# Patient Record
Sex: Male | Born: 1965 | Race: White | Hispanic: No | Marital: Married | Smoking: Current every day smoker
Health system: Southern US, Community
[De-identification: ages and names within clinical notes are randomized; demographics above are authoritative.]

## PROBLEM LIST (undated history)

## (undated) DIAGNOSIS — K219 Gastro-esophageal reflux disease without esophagitis: Secondary | ICD-10-CM

## (undated) DIAGNOSIS — M1990 Unspecified osteoarthritis, unspecified site: Secondary | ICD-10-CM

## (undated) DIAGNOSIS — F419 Anxiety disorder, unspecified: Secondary | ICD-10-CM

## (undated) DIAGNOSIS — E7800 Pure hypercholesterolemia, unspecified: Secondary | ICD-10-CM

## (undated) DIAGNOSIS — G2581 Restless legs syndrome: Secondary | ICD-10-CM

## (undated) DIAGNOSIS — J302 Other seasonal allergic rhinitis: Secondary | ICD-10-CM

---

## 2013-07-01 ENCOUNTER — Emergency Department (HOSPITAL_BASED_OUTPATIENT_CLINIC_OR_DEPARTMENT_OTHER): Payer: PRIVATE HEALTH INSURANCE

## 2013-07-01 ENCOUNTER — Encounter (HOSPITAL_BASED_OUTPATIENT_CLINIC_OR_DEPARTMENT_OTHER): Payer: Self-pay | Admitting: Emergency Medicine

## 2013-07-01 ENCOUNTER — Emergency Department (HOSPITAL_BASED_OUTPATIENT_CLINIC_OR_DEPARTMENT_OTHER)
Admission: EM | Admit: 2013-07-01 | Discharge: 2013-07-01 | Disposition: A | Discharge status: Home / Self Care | Payer: PRIVATE HEALTH INSURANCE | Attending: Emergency Medicine | Admitting: Emergency Medicine

## 2013-07-01 DIAGNOSIS — S59909A Unspecified injury of unspecified elbow, initial encounter: Secondary | ICD-10-CM | POA: Insufficient documentation

## 2013-07-01 DIAGNOSIS — F411 Generalized anxiety disorder: Secondary | ICD-10-CM | POA: Insufficient documentation

## 2013-07-01 DIAGNOSIS — Y92410 Unspecified street and highway as the place of occurrence of the external cause: Secondary | ICD-10-CM | POA: Insufficient documentation

## 2013-07-01 DIAGNOSIS — M25531 Pain in right wrist: Secondary | ICD-10-CM

## 2013-07-01 DIAGNOSIS — M129 Arthropathy, unspecified: Secondary | ICD-10-CM | POA: Insufficient documentation

## 2013-07-01 DIAGNOSIS — K219 Gastro-esophageal reflux disease without esophagitis: Secondary | ICD-10-CM | POA: Insufficient documentation

## 2013-07-01 DIAGNOSIS — G2581 Restless legs syndrome: Secondary | ICD-10-CM | POA: Insufficient documentation

## 2013-07-01 DIAGNOSIS — S6990XA Unspecified injury of unspecified wrist, hand and finger(s), initial encounter: Secondary | ICD-10-CM | POA: Insufficient documentation

## 2013-07-01 DIAGNOSIS — Y9389 Activity, other specified: Secondary | ICD-10-CM | POA: Insufficient documentation

## 2013-07-01 DIAGNOSIS — F17200 Nicotine dependence, unspecified, uncomplicated: Secondary | ICD-10-CM | POA: Insufficient documentation

## 2013-07-01 DIAGNOSIS — Z791 Long term (current) use of non-steroidal anti-inflammatories (NSAID): Secondary | ICD-10-CM | POA: Insufficient documentation

## 2013-07-01 DIAGNOSIS — Z79899 Other long term (current) drug therapy: Secondary | ICD-10-CM | POA: Insufficient documentation

## 2013-07-01 HISTORY — DX: Anxiety disorder, unspecified: F41.9

## 2013-07-01 HISTORY — DX: Gastro-esophageal reflux disease without esophagitis: K21.9

## 2013-07-01 HISTORY — DX: Restless legs syndrome: G25.81

## 2013-07-01 HISTORY — DX: Other seasonal allergic rhinitis: J30.2

## 2013-07-01 HISTORY — DX: Unspecified osteoarthritis, unspecified site: M19.90

## 2013-07-01 MED ORDER — IBUPROFEN 800 MG PO TABS
800.0000 mg | ORAL_TABLET | Freq: Once | ORAL | Status: AC
  Administered 2013-07-01: 800 mg via ORAL
  Filled 2013-07-01: qty 1
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
## 2013-07-01 NOTE — ED Notes (Signed)
MD at bedside. 

## 2013-07-01 NOTE — ED Notes (Signed)
Patient transported to X-ray 
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
## 2018-01-06 ENCOUNTER — Other Ambulatory Visit: Payer: Self-pay

## 2018-01-06 ENCOUNTER — Emergency Department (HOSPITAL_BASED_OUTPATIENT_CLINIC_OR_DEPARTMENT_OTHER)
Admission: EM | Admit: 2018-01-06 | Discharge: 2018-01-06 | Disposition: A | Discharge status: Home / Self Care | Payer: Commercial Managed Care - PPO | Attending: Emergency Medicine | Admitting: Emergency Medicine

## 2018-01-06 ENCOUNTER — Emergency Department (HOSPITAL_BASED_OUTPATIENT_CLINIC_OR_DEPARTMENT_OTHER): Payer: Commercial Managed Care - PPO

## 2018-01-06 ENCOUNTER — Encounter (HOSPITAL_BASED_OUTPATIENT_CLINIC_OR_DEPARTMENT_OTHER): Payer: Self-pay | Admitting: Emergency Medicine

## 2018-01-06 DIAGNOSIS — K5793 Diverticulitis of intestine, part unspecified, without perforation or abscess with bleeding: Secondary | ICD-10-CM | POA: Diagnosis not present

## 2018-01-06 DIAGNOSIS — K5792 Diverticulitis of intestine, part unspecified, without perforation or abscess without bleeding: Secondary | ICD-10-CM

## 2018-01-06 DIAGNOSIS — R1010 Upper abdominal pain, unspecified: Secondary | ICD-10-CM | POA: Diagnosis present

## 2018-01-06 HISTORY — DX: Pure hypercholesterolemia, unspecified: E78.00

## 2018-01-06 LAB — CBC
HCT: 46.3 % (ref 39.0–52.0)
HEMOGLOBIN: 15.7 g/dL (ref 13.0–17.0)
MCH: 30.1 pg (ref 26.0–34.0)
MCHC: 33.9 g/dL (ref 30.0–36.0)
MCV: 88.7 fL (ref 78.0–100.0)
Platelets: 274 10*3/uL (ref 150–400)
RBC: 5.22 MIL/uL (ref 4.22–5.81)
RDW: 13.8 % (ref 11.5–15.5)
WBC: 9.5 10*3/uL (ref 4.0–10.5)

## 2018-01-06 LAB — COMPREHENSIVE METABOLIC PANEL
ALT: 24 U/L (ref 17–63)
AST: 22 U/L (ref 15–41)
Albumin: 4.3 g/dL (ref 3.5–5.0)
Alkaline Phosphatase: 67 U/L (ref 38–126)
Anion gap: 8 (ref 5–15)
BILIRUBIN TOTAL: 0.4 mg/dL (ref 0.3–1.2)
BUN: 11 mg/dL (ref 6–20)
CO2: 25 mmol/L (ref 22–32)
Calcium: 9.1 mg/dL (ref 8.9–10.3)
Chloride: 104 mmol/L (ref 101–111)
Creatinine, Ser: 0.89 mg/dL (ref 0.61–1.24)
GFR calc Af Amer: >60 mL/min (ref >60)
GFR calc non Af Amer: >60 mL/min (ref >60)
GLUCOSE: 109 mg/dL — AB (ref 65–99)
Potassium: 4.2 mmol/L (ref 3.5–5.1)
Sodium: 137 mmol/L (ref 135–145)
TOTAL PROTEIN: 7.6 g/dL (ref 6.5–8.1)

## 2018-01-06 LAB — LIPASE, BLOOD: Lipase: 27 U/L (ref 11–51)

## 2018-01-06 LAB — URINALYSIS, ROUTINE W REFLEX MICROSCOPIC
Bilirubin Urine: NEGATIVE
Glucose, UA: NEGATIVE mg/dL
Hgb urine dipstick: NEGATIVE
KETONES UR: NEGATIVE mg/dL
LEUKOCYTES UA: NEGATIVE
NITRITE: NEGATIVE
PH: 6 (ref 5.0–8.0)
Protein, ur: NEGATIVE mg/dL
SPECIFIC GRAVITY, URINE: 1.015 (ref 1.005–1.030)

## 2018-01-06 MED ORDER — METRONIDAZOLE 500 MG PO TABS: 500.0000 mg | ORAL_TABLET | Freq: Three times a day (TID) | ORAL | 0 refills | Status: DC

## 2018-01-06 MED ORDER — METRONIDAZOLE 500 MG PO TABS
500.0000 mg | ORAL_TABLET | Freq: Once | ORAL | Status: AC
  Administered 2018-01-06: 500 mg via ORAL
  Filled 2018-01-06: qty 1

## 2018-01-06 MED ORDER — IOPAMIDOL (ISOVUE-300) INJECTION 61%
100.0000 mL | Freq: Once | INTRAVENOUS | Status: AC | PRN
  Administered 2018-01-06: 100 mL via INTRAVENOUS

## 2018-01-06 MED ORDER — CIPROFLOXACIN HCL 500 MG PO TABS
500.0000 mg | ORAL_TABLET | Freq: Once | ORAL | Status: AC
  Administered 2018-01-06: 500 mg via ORAL
  Filled 2018-01-06: qty 1

## 2018-01-06 MED ORDER — CIPROFLOXACIN HCL 500 MG PO TABS: 500.0000 mg | ORAL_TABLET | Freq: Two times a day (BID) | ORAL | 0 refills | Status: DC

## 2018-01-06 NOTE — Discharge Instructions (Addendum)
Take Flagyl, and Cipro as prescribed until completed. Contact your physician if not improving in the next 7 days. Stool softener as needed if not having bowel movement daily. Low residue/fiber diet until symptoms improve.  Then, resume high-fiber diet. Return to the ER as of any symptoms that are worsening-fever, worsening pain, bloody stools, other changes

## 2018-01-06 NOTE — ED Triage Notes (Signed)
Pt c/o lower abd pain for a "couple months". States the pain is worse today. Denies N/V/D

## 2018-01-06 NOTE — ED Provider Notes (Signed)
[facility] EMERGENCY DEPARTMENT Provider Note   CSN: [id number] Arrival date & time: 01/06/18  1055     History   Chief Complaint Chief Complaint  Patient presents with  . Abdominal Pain    HPI Peter Curtis is a 52 y.o. male.  Chief complaint is lower abdominal pain for "a few weeks".  HPI is a 52 year old male.  He describes suprapubic lower abdominal pain for the last "week or so".  No fevers.  No diarrhea.  Is having bowel movements.  No blood.  No nausea or vomiting.  He states his symptoms are worse with bending over.  He feels "bloated".  History of colonoscopy x3.  He had an anemia.  Had a guaiac positive stool.  Had a colonoscopy that showed a polyp.  He had a follow-up in 3 years it was negative.  He does have diverticular disease per his colonoscopy.  Past Medical History:  Diagnosis Date  . Anxiety   . Arthritis   . GERD (gastroesophageal reflux disease)   . High cholesterol   . Restless leg syndrome   . Seasonal allergies     There are no active problems to display for this patient.   History reviewed. No pertinent surgical history.      Home Medications    Prior to Admission medications   Medication Sig Start Date End Date Taking? Authorizing Provider  celecoxib (CELEBREX) 200 MG capsule Take 200 mg by mouth daily.    [provider]  ciprofloxacin (CIPRO) 500 MG tablet Take 1 tablet (500 mg total) by mouth every 12 (twelve) hours. 01/06/18   Rolland PorterJames, Tanita Palinkas, MD  Esomeprazole Magnesium (NEXIUM PO) Take 1 tablet by mouth. OTC    [provider]  gabapentin (NEURONTIN) 600 MG tablet Take 600 mg by mouth 2 (two) times daily.    [provider]  metroNIDAZOLE (FLAGYL) 500 MG tablet Take 1 tablet (500 mg total) by mouth 3 (three) times daily. 01/06/18   Rolland PorterJames, Reva Pinkley, MD    Family History No family history on file.  Social History Social History   Tobacco Use  . Smoking status: Current Every Day Smoker    Packs/day:  0.50  . Smokeless tobacco: Never Used  Substance Use Topics  . Alcohol use: Yes  . Drug use: No     Allergies   Vicodin [hydrocodone-acetaminophen]   Review of Systems Review of Systems  Constitutional: Negative for appetite change, chills, diaphoresis, fatigue and fever.  HENT: Negative for mouth sores, sore throat and trouble swallowing.   Eyes: Negative for visual disturbance.  Respiratory: Negative for cough, chest tightness, shortness of breath and wheezing.   Cardiovascular: Negative for chest pain.  Gastrointestinal: Positive for abdominal pain. Negative for abdominal distention, diarrhea, nausea and vomiting.  Endocrine: Negative for polydipsia, polyphagia and polyuria.  Genitourinary: Negative for dysuria, frequency and hematuria.  Musculoskeletal: Negative for gait problem.  Skin: Negative for color change, pallor and rash.  Neurological: Negative for dizziness, syncope, light-headedness and headaches.  Hematological: Does not bruise/bleed easily.  Psychiatric/Behavioral: Negative for behavioral problems and confusion.     Physical Exam Updated Vital Signs BP (!) 151/107 (BP Location: Right Arm)   Pulse 64   Temp 98.2 F (36.8 C) (Oral)   Resp 16   Ht 6' (1.829 m)   Wt 89.4 kg (197 lb)   SpO2 99%   BMI 26.72 kg/m   Physical Exam  Constitutional: He is oriented to person, place, and time. He appears well-developed  and well-nourished. No distress.  HENT:  Head: Normocephalic.  Eyes: Pupils are equal, round, and reactive to light. Conjunctivae are normal. No scleral icterus.  Neck: Normal range of motion. Neck supple. No thyromegaly present.  Cardiovascular: Normal rate and regular rhythm. Exam reveals no gallop and no friction rub.  No murmur heard. Pulmonary/Chest: Effort normal and breath sounds normal. No respiratory distress. He has no wheezes. He has no rales.  Abdominal: Soft. Bowel sounds are normal. He exhibits no distension. There is no tenderness.  There is no rebound.  Mild diffuse tenderness to the suprapubic abdomen more so to the left no right lower quadrant tenderness.  He has no tenderness or palpable hernias in his inguinal canals.  States he has known inguinal hernias and scheduled for elective repair next week.  Not incarcerated herniated at this time  Musculoskeletal: Normal range of motion.  Neurological: He is alert and oriented to person, place, and time.  Skin: Skin is warm and dry. No rash noted.  Psychiatric: He has a normal mood and affect. His behavior is normal.     ED Treatments / Results  Labs (all labs ordered are listed, but only abnormal results are displayed) Labs Reviewed  COMPREHENSIVE METABOLIC PANEL - Abnormal; Notable for the following components:      Result Value   Glucose, Bld 109 (*)    All other components within normal limits  LIPASE, BLOOD  CBC  URINALYSIS, ROUTINE W REFLEX MICROSCOPIC    EKG None  Radiology Ct Abdomen Pelvis W Contrast  Result Date: 01/06/2018 CLINICAL DATA:  Worsening abdominal pain and nausea over the past 2 months. EXAM: CT ABDOMEN AND PELVIS WITH CONTRAST TECHNIQUE: Multidetector CT imaging of the abdomen and pelvis was performed using the standard protocol following bolus administration of intravenous contrast. CONTRAST:  100 ml ISOVUE-300 IOPAMIDOL (ISOVUE-300) INJECTION 61% COMPARISON:  None. FINDINGS: Lower chest: Lung bases are clear. No pleural or pericardial effusion. Hepatobiliary: The liver is low attenuating consistent with fatty infiltration. No focal lesion. Gallbladder and biliary tree are unremarkable. Pancreas: Unremarkable. No pancreatic ductal dilatation or surrounding inflammatory changes. Spleen: Normal in size without focal abnormality. Adrenals/Urinary Tract: Adrenal glands are unremarkable. Kidneys are normal, without renal calculi, focal lesion, or hydronephrosis. Bladder is unremarkable. Stomach/Bowel: Diverticulosis is worst in the sigmoid. There is  mild stranding about a diverticulum at the junction of the descending colon and sigmoid compatible with diverticulitis. No abscess or perforation. The colon is otherwise unremarkable. The stomach, small bowel and appendix appear normal. Vascular/Lymphatic: Aortic atherosclerosis. No enlarged abdominal or pelvic lymph nodes. Reproductive: Prostate is unremarkable. Other: No fluid collection.  No free intraperitoneal air. Musculoskeletal: Negative. IMPRESSION: Diverticulosis is most extensive in the sigmoid. Mild stranding about a diverticulum at the junction of the descending and sigmoid colon compatible with acute diverticulitis. No abscess or perforation. Fatty infiltration of the liver. Atherosclerosis. Electronically Signed   By: Drusilla Kanner M.D.   On: 01/06/2018 12:19    Procedures Procedures (including critical care time)  Medications Ordered in ED Medications  iopamidol (ISOVUE-300) 61 % injection 100 mL (100 mLs Intravenous Contrast Given 01/06/18 1206)  metroNIDAZOLE (FLAGYL) tablet 500 mg (500 mg Oral Given 01/06/18 1256)  ciprofloxacin (CIPRO) tablet 500 mg (500 mg Oral Given 01/06/18 1256)     Initial Impression / Assessment and Plan / ED Course  I have reviewed the triage vital signs and the nursing notes.  Pertinent labs & imaging results that were available during my care of the  patient were reviewed by me and considered in my medical decision making (see chart for details).    CT scan shows extensive diverticulosis, with minimal localized diverticulitis with.  Diverticular inflammation but no perforation abscess or fluid collection.  Given p.o. Cipro and Flagyl.  Discharged with same.  Stool softener.  Avoid fiber until improving then resume high-fiber diet.  Return to ER with worsening, discussed.  Final Clinical Impressions(s) / ED Diagnoses   Final diagnoses:  Diverticulitis    ED Discharge Orders        Ordered    ciprofloxacin (CIPRO) 500 MG tablet  Every 12 hours      01/06/18 1317    metroNIDAZOLE (FLAGYL) 500 MG tablet  3 times daily     01/06/18 1317       Rolland Porter, MD 01/06/18 [phone]

## 2018-01-22 ENCOUNTER — Ambulatory Visit: Payer: Commercial Managed Care - PPO | Admitting: Podiatry

## 2020-02-01 IMAGING — CT CT ABD-PELV W/ CM
2 of 5 series · 16 of 46 positions shown, 18 images · IV contrast (APPLIED)
Comparison: None.

CLINICAL DATA: Worsening abdominal pain and nausea over the past 2
months.

EXAM:
CT ABDOMEN AND PELVIS WITH CONTRAST
TECHNIQUE: Multidetector CT imaging of the abdomen and pelvis was performed
using the standard protocol following bolus administration of
intravenous contrast.
CONTRAST:  100 ml KWUOOR-2HH IOPAMIDOL (KWUOOR-2HH) INJECTION 61%

[Series 2: axial st · axial · 0.80mm/px · z∈[-509,-84]mm · 13 of 95 slices shown, 15 images]
[im 5/95  soft-tissue]
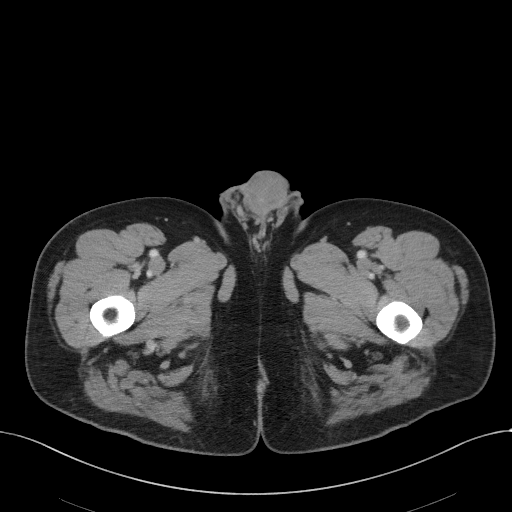
[im 5/95  bone]
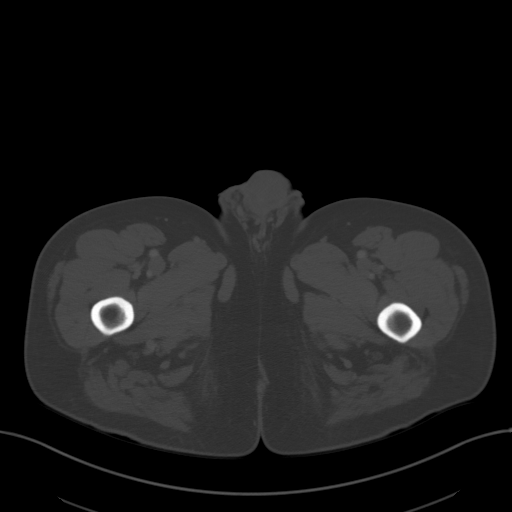
[im 14/95  soft-tissue]
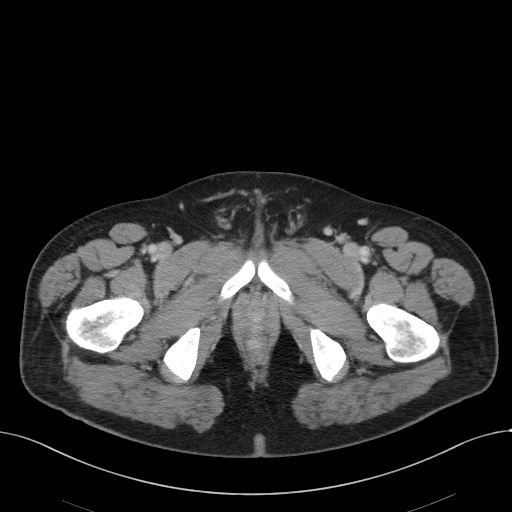
[im 18/95  soft-tissue]
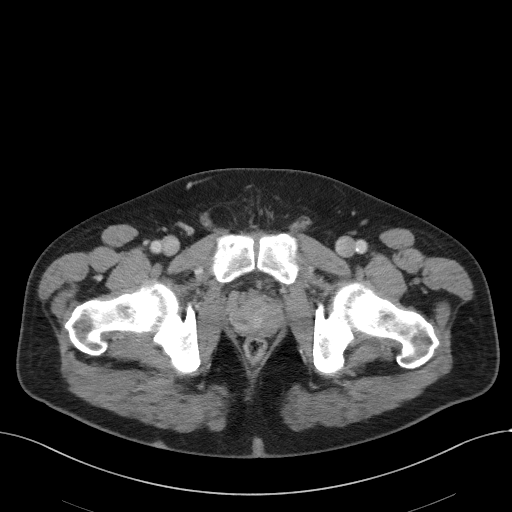
[im 27/95  soft-tissue]
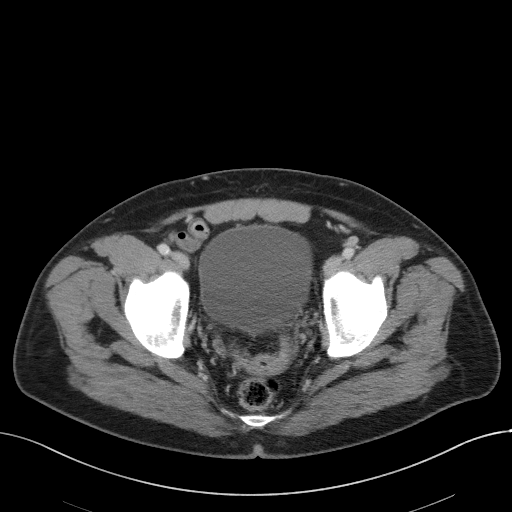
[im 32/95  soft-tissue]
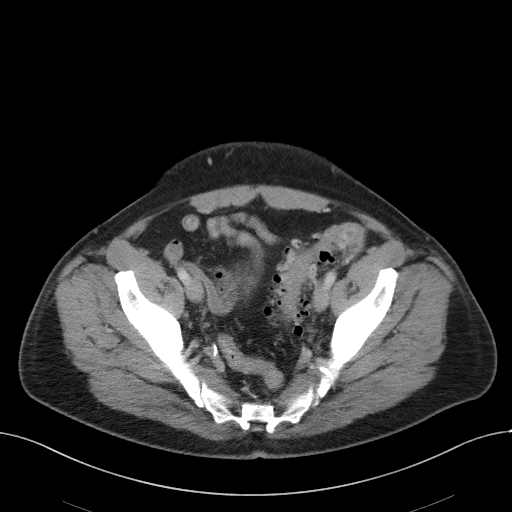
[im 41/95  soft-tissue]
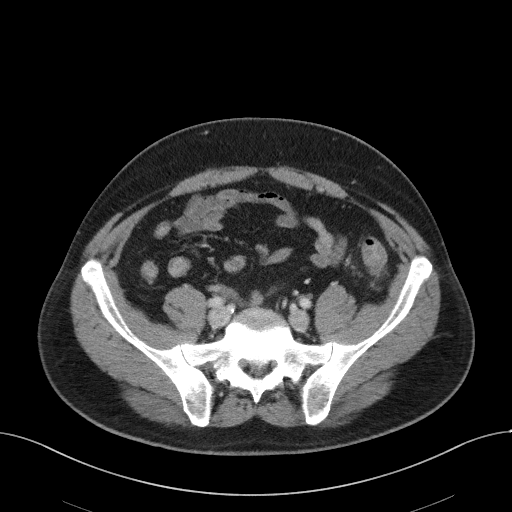
[im 50/95  soft-tissue]
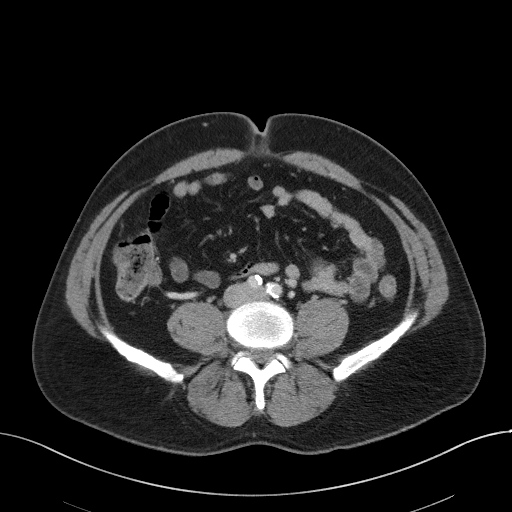
[im 54/95  soft-tissue]
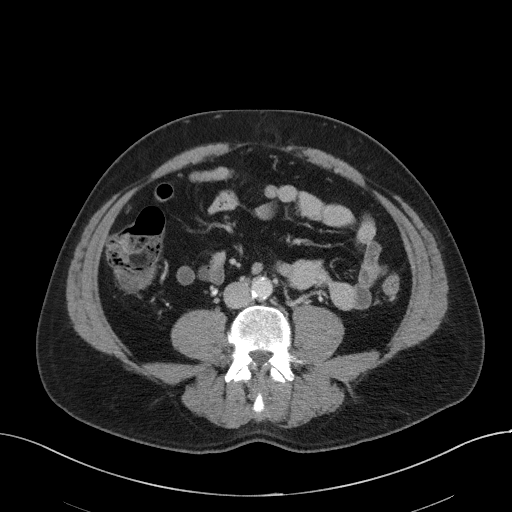
[im 63/95  soft-tissue]
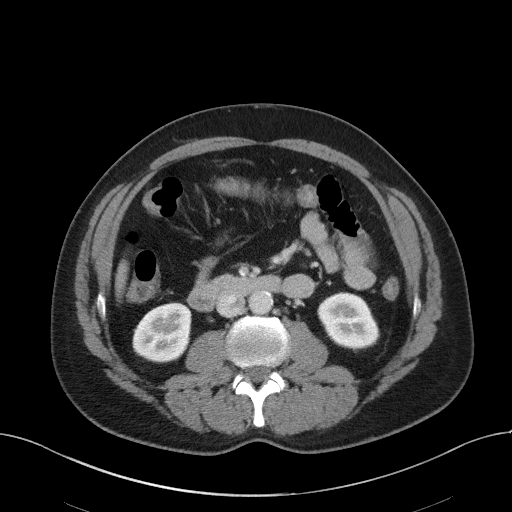
[im 63/95  bone]
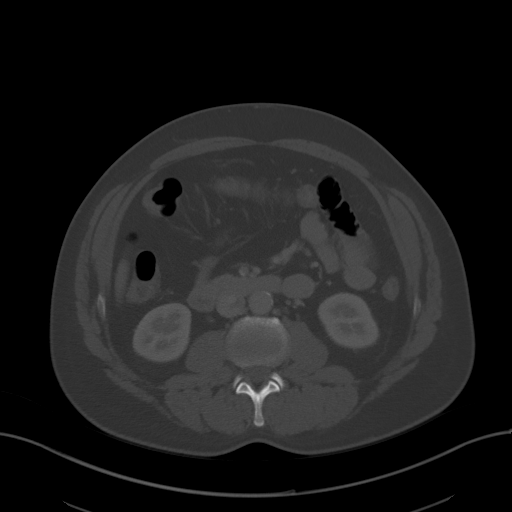
[im 68/95  soft-tissue]
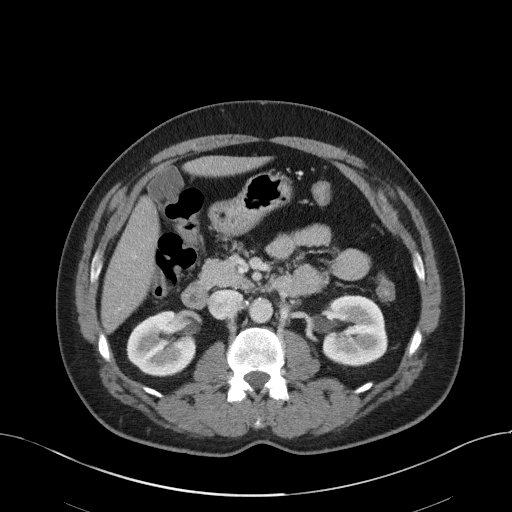
[im 77/95  soft-tissue]
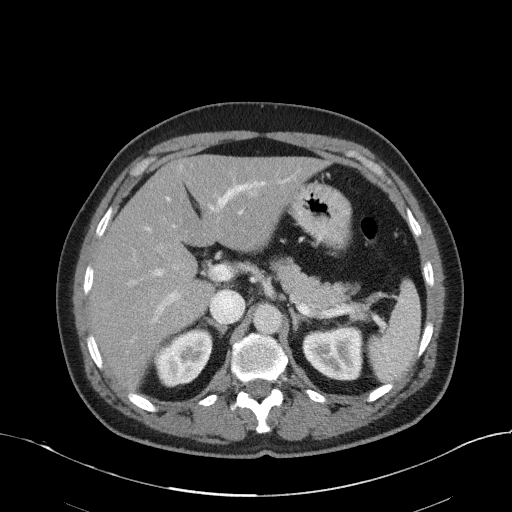
[im 81/95  soft-tissue]
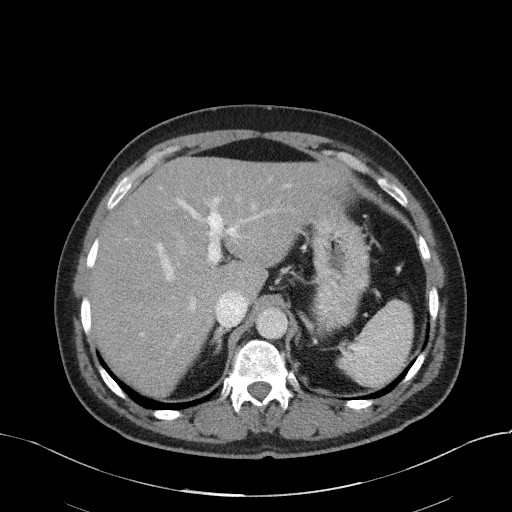
[im 90/95  soft-tissue]
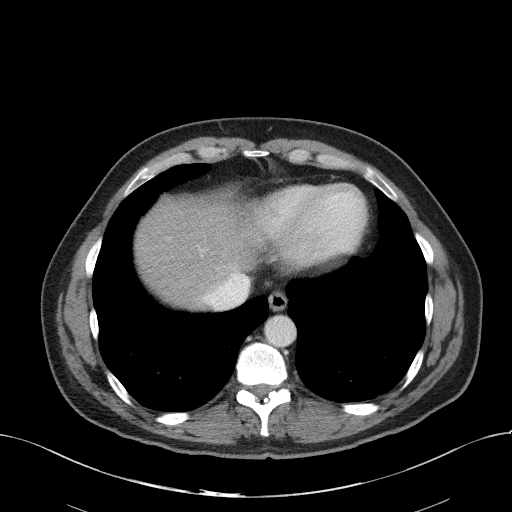

[Series 5: coronal st · coronal · 0.74mm/px · 3 of 95 slices shown]
[im 32/95  soft-tissue]
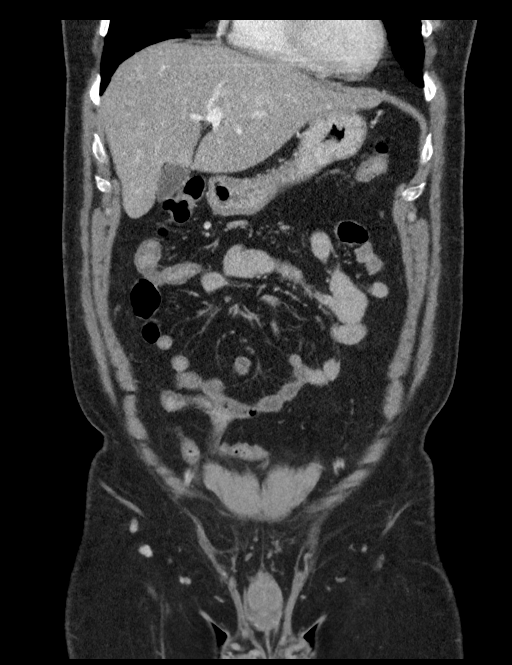
[im 42/95  soft-tissue]
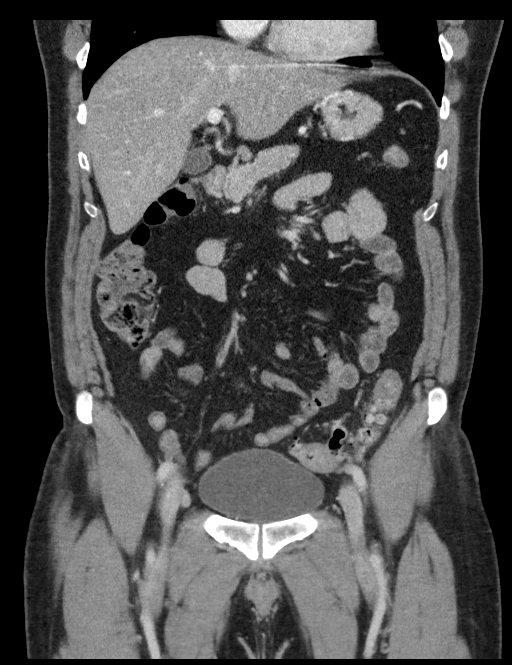
[im 53/95  soft-tissue]
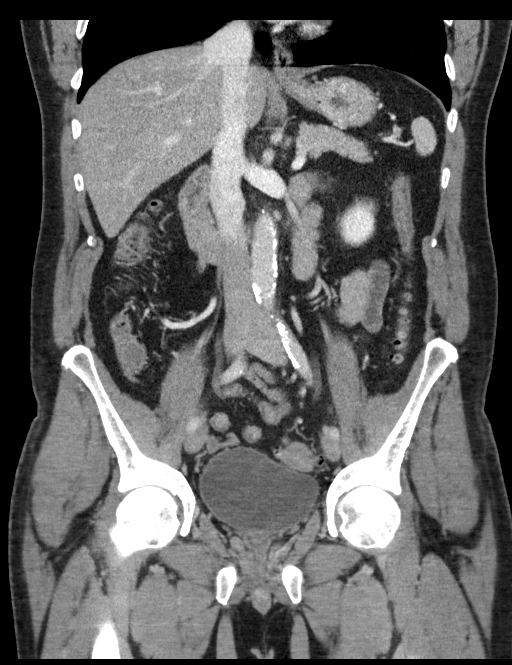

[16 of 46 positions shown; findings below may reference images not displayed]

FINDINGS: Lower chest: Lung bases are clear. No pleural or pericardial
effusion.

Hepatobiliary: The liver is low attenuating consistent with fatty
infiltration. No focal lesion. Gallbladder and biliary tree are
unremarkable.

Pancreas: Unremarkable. No pancreatic ductal dilatation or
surrounding inflammatory changes.

Spleen: Normal in size without focal abnormality.

Adrenals/Urinary Tract: Adrenal glands are unremarkable. Kidneys are
normal, without renal calculi, focal lesion, or hydronephrosis.
Bladder is unremarkable.

Stomach/Bowel: Diverticulosis is worst in the sigmoid. There is mild
stranding about a diverticulum at the junction of the descending
colon and sigmoid compatible with diverticulitis. No abscess or
perforation. The colon is otherwise unremarkable. The stomach, small
bowel and appendix appear normal.

Vascular/Lymphatic: Aortic atherosclerosis. No enlarged abdominal or
pelvic lymph nodes.

Reproductive: Prostate is unremarkable.

Other: No fluid collection.  No free intraperitoneal air.

Musculoskeletal: Negative.
IMPRESSION: Diverticulosis is most extensive in the sigmoid. Mild stranding
about a diverticulum at the junction of the descending and sigmoid
colon compatible with acute diverticulitis. No abscess or
perforation.

Fatty infiltration of the liver.

Atherosclerosis.

## 2021-08-11 ENCOUNTER — Encounter (HOSPITAL_BASED_OUTPATIENT_CLINIC_OR_DEPARTMENT_OTHER): Payer: Self-pay | Admitting: Otolaryngology

## 2021-08-11 ENCOUNTER — Other Ambulatory Visit: Payer: Self-pay | Admitting: Otolaryngology

## 2021-08-11 ENCOUNTER — Other Ambulatory Visit: Payer: Self-pay

## 2021-08-17 ENCOUNTER — Encounter (HOSPITAL_BASED_OUTPATIENT_CLINIC_OR_DEPARTMENT_OTHER): Payer: Self-pay | Admitting: Otolaryngology

## 2021-08-17 ENCOUNTER — Ambulatory Visit (HOSPITAL_BASED_OUTPATIENT_CLINIC_OR_DEPARTMENT_OTHER): Payer: Commercial Managed Care - PPO | Admitting: Anesthesiology

## 2021-08-17 ENCOUNTER — Encounter (HOSPITAL_BASED_OUTPATIENT_CLINIC_OR_DEPARTMENT_OTHER)
Admission: RE | Disposition: A | Discharge status: Home / Self Care | Payer: Self-pay | Source: Home / Self Care | Attending: Otolaryngology

## 2021-08-17 ENCOUNTER — Ambulatory Visit (HOSPITAL_BASED_OUTPATIENT_CLINIC_OR_DEPARTMENT_OTHER)
Admission: RE | Admit: 2021-08-17 | Discharge: 2021-08-17 | Disposition: A | Discharge status: Home / Self Care | Payer: Commercial Managed Care - PPO | Attending: Otolaryngology | Admitting: Otolaryngology

## 2021-08-17 ENCOUNTER — Other Ambulatory Visit: Payer: Self-pay

## 2021-08-17 DIAGNOSIS — K219 Gastro-esophageal reflux disease without esophagitis: Secondary | ICD-10-CM | POA: Insufficient documentation

## 2021-08-17 DIAGNOSIS — M1990 Unspecified osteoarthritis, unspecified site: Secondary | ICD-10-CM | POA: Insufficient documentation

## 2021-08-17 DIAGNOSIS — F17210 Nicotine dependence, cigarettes, uncomplicated: Secondary | ICD-10-CM | POA: Diagnosis not present

## 2021-08-17 DIAGNOSIS — J342 Deviated nasal septum: Secondary | ICD-10-CM

## 2021-08-17 DIAGNOSIS — J343 Hypertrophy of nasal turbinates: Secondary | ICD-10-CM | POA: Insufficient documentation

## 2021-08-17 HISTORY — PX: NASAL SEPTOPLASTY W/ TURBINOPLASTY: SHX2070

## 2021-08-17 SURGERY — SEPTOPLASTY, NOSE, WITH NASAL TURBINATE REDUCTION
Anesthesia: General | Site: Nose | Laterality: Bilateral

## 2021-08-17 MED ORDER — OXYCODONE HCL 5 MG PO TABS: 5.0000 mg | ORAL_TABLET | Freq: Once | ORAL | Status: DC | PRN

## 2021-08-17 MED ORDER — DEXAMETHASONE SODIUM PHOSPHATE 4 MG/ML IJ SOLN
INTRAMUSCULAR | Status: DC | PRN
Start: 2021-08-17 — End: 2021-08-17
  Administered 2021-08-17: 10 mg via INTRAVENOUS

## 2021-08-17 MED ORDER — AMISULPRIDE (ANTIEMETIC) 5 MG/2ML IV SOLN: 10.0000 mg | Freq: Once | INTRAVENOUS | Status: DC | PRN

## 2021-08-17 MED ORDER — MUPIROCIN 2 % EX OINT
TOPICAL_OINTMENT | CUTANEOUS | Status: AC
  Filled 2021-08-17: qty 22

## 2021-08-17 MED ORDER — OXYCODONE HCL 5 MG/5ML PO SOLN: 5.0000 mg | Freq: Once | ORAL | Status: DC | PRN

## 2021-08-17 MED ORDER — FENTANYL CITRATE (PF) 100 MCG/2ML IJ SOLN
INTRAMUSCULAR | Status: DC | PRN
  Administered 2021-08-17: 100 ug via INTRAVENOUS

## 2021-08-17 MED ORDER — BUPIVACAINE HCL (PF) 0.25 % IJ SOLN
INTRAMUSCULAR | Status: AC
  Filled 2021-08-17: qty 30

## 2021-08-17 MED ORDER — LIDOCAINE-EPINEPHRINE 1 %-1:100000 IJ SOLN
INTRAMUSCULAR | Status: DC | PRN
  Administered 2021-08-17: 6 mL

## 2021-08-17 MED ORDER — LABETALOL HCL 5 MG/ML IV SOLN
5.0000 mg | Freq: Once | INTRAVENOUS | Status: AC
  Administered 2021-08-17: 10:00:00 5 mg via INTRAVENOUS

## 2021-08-17 MED ORDER — MIDAZOLAM HCL 2 MG/2ML IJ SOLN
INTRAMUSCULAR | Status: AC
  Filled 2021-08-17: qty 2

## 2021-08-17 MED ORDER — PROPOFOL 10 MG/ML IV BOLUS
INTRAVENOUS | Status: DC | PRN
  Administered 2021-08-17: 160 mg via INTRAVENOUS

## 2021-08-17 MED ORDER — ROCURONIUM BROMIDE 100 MG/10ML IV SOLN
INTRAVENOUS | Status: DC | PRN
  Administered 2021-08-17: 80 mg via INTRAVENOUS

## 2021-08-17 MED ORDER — SUGAMMADEX SODIUM 500 MG/5ML IV SOLN
INTRAVENOUS | Status: DC | PRN
  Administered 2021-08-17: 300 mg via INTRAVENOUS

## 2021-08-17 MED ORDER — PHENYLEPHRINE 40 MCG/ML (10ML) SYRINGE FOR IV PUSH (FOR BLOOD PRESSURE SUPPORT)
PREFILLED_SYRINGE | INTRAVENOUS | Status: AC
  Filled 2021-08-17: qty 10

## 2021-08-17 MED ORDER — ROCURONIUM BROMIDE 10 MG/ML (PF) SYRINGE
PREFILLED_SYRINGE | INTRAVENOUS | Status: AC
  Filled 2021-08-17: qty 10

## 2021-08-17 MED ORDER — HYDROMORPHONE HCL 1 MG/ML IJ SOLN
INTRAMUSCULAR | Status: AC
  Filled 2021-08-17: qty 0.5

## 2021-08-17 MED ORDER — PROMETHAZINE HCL 25 MG/ML IJ SOLN: 6.2500 mg | INTRAMUSCULAR | Status: DC | PRN

## 2021-08-17 MED ORDER — LACTATED RINGERS IV SOLN: INTRAVENOUS | Status: DC

## 2021-08-17 MED ORDER — LIDOCAINE HCL (CARDIAC) PF 100 MG/5ML IV SOSY
PREFILLED_SYRINGE | INTRAVENOUS | Status: DC | PRN
  Administered 2021-08-17: 80 mg via INTRAVENOUS

## 2021-08-17 MED ORDER — LIDOCAINE-EPINEPHRINE (PF) 1 %-1:200000 IJ SOLN
INTRAMUSCULAR | Status: AC
  Filled 2021-08-17: qty 30

## 2021-08-17 MED ORDER — MIDAZOLAM HCL 5 MG/5ML IJ SOLN
INTRAMUSCULAR | Status: DC | PRN
  Administered 2021-08-17: 2 mg via INTRAVENOUS

## 2021-08-17 MED ORDER — CEPHALEXIN 500 MG PO CAPS: 500.0000 mg | ORAL_CAPSULE | Freq: Three times a day (TID) | ORAL | 0 refills | Status: AC

## 2021-08-17 MED ORDER — ONDANSETRON HCL 4 MG/2ML IJ SOLN
INTRAMUSCULAR | Status: AC
  Filled 2021-08-17: qty 2

## 2021-08-17 MED ORDER — MUPIROCIN 2 % EX OINT
TOPICAL_OINTMENT | CUTANEOUS | Status: DC | PRN
  Administered 2021-08-17: 1 via NASAL

## 2021-08-17 MED ORDER — OXYMETAZOLINE HCL 0.05 % NA SOLN
NASAL | Status: DC | PRN
  Administered 2021-08-17: 1 via TOPICAL

## 2021-08-17 MED ORDER — MEPERIDINE HCL 25 MG/ML IJ SOLN: 6.2500 mg | INTRAMUSCULAR | Status: DC | PRN

## 2021-08-17 MED ORDER — ONDANSETRON HCL 4 MG/2ML IJ SOLN
INTRAMUSCULAR | Status: DC | PRN
  Administered 2021-08-17: 4 mg via INTRAVENOUS

## 2021-08-17 MED ORDER — DEXAMETHASONE SODIUM PHOSPHATE 10 MG/ML IJ SOLN
INTRAMUSCULAR | Status: AC
  Filled 2021-08-17: qty 1

## 2021-08-17 MED ORDER — BACITRACIN ZINC 500 UNIT/GM EX OINT
TOPICAL_OINTMENT | CUTANEOUS | Status: AC
  Filled 2021-08-17: qty 28.35

## 2021-08-17 MED ORDER — LIDOCAINE-EPINEPHRINE 2 %-1:100000 IJ SOLN
INTRAMUSCULAR | Status: AC
  Filled 2021-08-17: qty 1

## 2021-08-17 MED ORDER — LABETALOL HCL 5 MG/ML IV SOLN
INTRAVENOUS | Status: AC
  Filled 2021-08-17: qty 4

## 2021-08-17 MED ORDER — FENTANYL CITRATE (PF) 100 MCG/2ML IJ SOLN
INTRAMUSCULAR | Status: AC
  Filled 2021-08-17: qty 2

## 2021-08-17 MED ORDER — HYDROMORPHONE HCL 1 MG/ML IJ SOLN
0.2500 mg | INTRAMUSCULAR | Status: DC | PRN
  Administered 2021-08-17 (×2): 0.25 mg via INTRAVENOUS

## 2021-08-17 SURGICAL SUPPLY — 31 items
ATTRACTOMAT 16X20 MAGNETIC DRP (DRAPES) IMPLANT
BLADE SURG 15 STRL LF DISP TIS (BLADE) IMPLANT
BLADE SURG 15 STRL SS (BLADE) ×2
CANISTER SUCT 1200ML W/VALVE (MISCELLANEOUS) ×2 IMPLANT
COAGULATOR SUCT 8FR VV (MISCELLANEOUS) IMPLANT
DECANTER SPIKE VIAL GLASS SM (MISCELLANEOUS) IMPLANT
DRSG NASOPORE 8CM (GAUZE/BANDAGES/DRESSINGS) IMPLANT
DRSG TELFA 3X8 NADH (GAUZE/BANDAGES/DRESSINGS) IMPLANT
ELECT REM PT RETURN 9FT ADLT (ELECTROSURGICAL)
ELECTRODE REM PT RTRN 9FT ADLT (ELECTROSURGICAL) IMPLANT
GLOVE SURG ENC TEXT LTX SZ7 (GLOVE) ×4 IMPLANT
GOWN STRL REUS W/ TWL LRG LVL3 (GOWN DISPOSABLE) ×2 IMPLANT
GOWN STRL REUS W/TWL LRG LVL3 (GOWN DISPOSABLE) ×4
IV SET EXT 30 76VOL 4 MALE LL (IV SETS) ×2 IMPLANT
NDL HYPO 27GX1-1/4 (NEEDLE) ×1 IMPLANT
NEEDLE HYPO 27GX1-1/4 (NEEDLE) ×2 IMPLANT
NS IRRIG 1000ML POUR BTL (IV SOLUTION) IMPLANT
PACK BASIN DAY SURGERY FS (CUSTOM PROCEDURE TRAY) ×2 IMPLANT
PACK ENT DAY SURGERY (CUSTOM PROCEDURE TRAY) ×2 IMPLANT
PAD DRESSING TELFA 3X8 NADH (GAUZE/BANDAGES/DRESSINGS) IMPLANT
SLEEVE SCD COMPRESS KNEE MED (STOCKING) ×1 IMPLANT
SPLINT NASAL AIRWAY SILICONE (MISCELLANEOUS) ×2 IMPLANT
SPONGE GAUZE 2X2 8PLY STRL LF (GAUZE/BANDAGES/DRESSINGS) ×2 IMPLANT
SPONGE NEURO XRAY DETECT 1X3 (DISPOSABLE) ×2 IMPLANT
SPONGE SURGIFOAM ABS GEL 12-7 (HEMOSTASIS) IMPLANT
SUT ETHILON 3 0 PS 1 (SUTURE) ×2 IMPLANT
SUT PLAIN 4 0 ~~LOC~~ 1 (SUTURE) ×2 IMPLANT
TOWEL GREEN STERILE FF (TOWEL DISPOSABLE) ×2 IMPLANT
TUBE SALEM SUMP 12R W/ARV (TUBING) IMPLANT
TUBE SALEM SUMP 16 FR W/ARV (TUBING) IMPLANT
YANKAUER SUCT BULB TIP NO VENT (SUCTIONS) ×2 IMPLANT

## 2021-08-17 NOTE — Anesthesia Postprocedure Evaluation (Signed)
Anesthesia Post Note  Patient: Peter Curtis  Procedure(s) Performed: NASAL SEPTOPLASTY WITH BILATERAL TURBINATE REDUCTION (Bilateral: Nose)     Patient location during evaluation: PACU Anesthesia Type: General Level of consciousness: awake and alert Pain management: pain level controlled Vital Signs Assessment: post-procedure vital signs reviewed and stable Respiratory status: spontaneous breathing, nonlabored ventilation and respiratory function stable Cardiovascular status: blood pressure returned to baseline and stable Postop Assessment: no apparent nausea or vomiting Anesthetic complications: no   No notable events documented.  Last Vitals:  Vitals:   08/17/21 1015 08/17/21 1020  BP: (!) 173/100 (!) 152/98  Pulse: 69   Resp: 18   Temp: 36.7 C   SpO2: 95%     Last Pain:  Vitals:   08/17/21 1015  TempSrc:   PainSc: 3                  Lowella Curb

## 2021-08-17 NOTE — Discharge Instructions (Signed)

## 2021-08-17 NOTE — Transfer of Care (Signed)
Immediate Anesthesia Transfer of Care Note  Patient: Peter Curtis  Procedure(s) Performed: NASAL SEPTOPLASTY WITH BILATERAL TURBINATE REDUCTION (Bilateral: Nose)  Patient Location: PACU  Anesthesia Type:General  Level of Consciousness: awake  Airway & Oxygen Therapy: Patient Spontanous Breathing and Patient connected to face mask oxygen  Post-op Assessment: Report given to RN and Post -op Vital signs reviewed and stable  Post vital signs: Reviewed and stable  Last Vitals:  Vitals Value Taken Time  BP 193/108 08/17/21 0858  Temp    Pulse 77 08/17/21 0859  Resp 11 08/17/21 0859  SpO2 100 % 08/17/21 0859  Vitals shown include unvalidated device data.  Last Pain:  Vitals:   08/17/21 0722  TempSrc: Oral  PainSc: 0-No pain      Patients Stated Pain Goal: 3 (08/17/21 8119)  Complications: No notable events documented.

## 2021-08-17 NOTE — H&P (Signed)
Peter Curtis is an 56 y.o. male.   Chief Complaint: Nasal airway obstruction HPI: History of progressive nasal airway obstruction with failure to respond to medical therapy.  Patient found to have severely deviated septum and turbinate hypertrophy.  Past Medical History:  Diagnosis Date   Anxiety    Arthritis    GERD (gastroesophageal reflux disease)    High cholesterol    Restless leg syndrome    Seasonal allergies     History reviewed. No pertinent surgical history.  History reviewed. No pertinent family history. Social History:  reports that he has been smoking cigarettes. He has been smoking an average of .5 packs per day. He has never used smokeless tobacco. He reports current alcohol use. He reports that he does not use drugs.  Allergies: No Active Allergies  Medications Prior to Admission  Medication Sig Dispense Refill   Esomeprazole Magnesium (NEXIUM PO) Take 1 tablet by mouth. OTC     gabapentin (NEURONTIN) 600 MG tablet Take 600 mg by mouth 2 (two) times daily.     Multiple Vitamins-Minerals (VITAMIN D3 COMPLETE PO) Take by mouth.      No results found for this or any previous visit (from the past 48 hour(s)). No results found.  Review of Systems  HENT:  Positive for congestion.   Respiratory: Negative.     Blood pressure (!) 144/89, pulse 69, temperature 97.9 F (36.6 C), temperature source Oral, resp. rate 16, height 6' (1.829 m), weight 76.8 kg, SpO2 100 %. Physical Exam Constitutional:      Appearance: Normal appearance.  HENT:     Nose:     Comments: Deviated nasal septum Cardiovascular:     Rate and Rhythm: Normal rate.  Pulmonary:     Effort: Pulmonary effort is normal.  Musculoskeletal:     Cervical back: Normal range of motion.  Neurological:     Mental Status: He is alert.     Assessment/Plan Patient admitted for outpatient surgery under general anesthesia: Nasal septoplasty and bilateral inferior turbinate reduction.  Osborn Coho,  MD 08/17/2021, 7:40 AM

## 2021-08-17 NOTE — Op Note (Signed)
Operative Note: SEPTOPLASTY AND INFERIOR TURBINATE REDUCTION  Patient: Peter Curtis  Medical record number: [id number]  Date:08/17/2021  Pre-operative Indications: 1. Deviated nasal septum with nasal airway obstruction     2.  Bilateral inferior turbinate hypertrophy  Postoperative Indications: Same  Surgical Procedure: 1.  Nasal Septoplasty    2.  Bilateral Inferior Turbinate Reduction  Anesthesia: GET  Surgeon: Barbee Cough, M.D.  Complications: None  EBL: 50 cc  Findings: Severely deviated nasal septum with airway obstruction and bilateral inferior turbinate hypertrophy.   Brief History: The patient is a 56 y.o. male with a history of progressive nasal airway obstruction. The patient has been on medical therapy to reduce nasal mucosal edema including saline nasal spray and topical nasal steroids. Despite appropriate medical therapy the patient continues to have ongoing symptoms. Given the patient's history and findings, the above surgical procedures were recommended, risks and benefits were discussed in detail with the patient may understand and agree with our plan for surgery which is scheduled at [facility] under general anesthesia as an outpatient.  Surgical Procedure: The patient is brought to the operating room on 08/17/2021 and placed in supine position on the operating table. General endotracheal anesthesia was established without difficulty. When the patient was adequately anesthetized, surgical timeout was performed and correct identification of the patient and the surgical procedure. The patient's nose was then injected with 6 cc of 1% lidocaine 1:100,000 dilution epinephrine which was injected in a submucosal fashion. The patient's nose was then packed with Afrin-soaked cottonoid pledgets were left in place for approximately 10 minutes lateral vasoconstriction and hemostasis.  With the patient prepped draped and prepared for surgery, nasal septoplasty was begun.  A left  anterior hemitransfixion incision was created and a mucoperichondrial flap was elevated from anterior to posterior on the left-hand side. The anterior cartilaginous septum was crossed at the midline and a mucoperichondrial flap was elevated on the patient's right.  Swivel knife was then used to resect the anterior and mid cartilaginous portion of the nasal septum.  Resected cartilage was morcellized and returned to the mucoperichondrial pocket at the occlusion of the surgical procedure.  Dissection was then carried out from anterior to posterior removing deviated bone and cartilage including a large septal spur the overlying mucosa was preserved.  With the septum brought to good midline position, the morselized cartilage was returned to the mucoperichondrial pocket and the soft tissue/mucosal flaps were reapproximated with interrupted 4-0 gut suture on a Keith needle in a horizontal mattressing fashion.  Anterior hemitransfixion incision was closed with the same stitch.  Bilateral Doyle nasal septal splints were then placed after the application of Bactroban ointment and sutured in position with a 3-0 Ethilon suture.  Attention was then turned to the inferior turbinates, bilateral inferior turbinate intramural cautery was performed with cautery setting at 12 W.  2 submucosal passes were made in each inferior turbinate.  After completing cautery, anterior vertical incisions were created and overlying soft tissue was elevated, a small amount of turbinate bone was resected.  The turbinates were then outfractured to create a more patent nasal passageway.  Surgical sponge count was correct. An oral gastric tube was passed and the stomach contents were aspirated. Patient was awakened from anesthetic and transferred from the operating room to the recovery room in stable condition. There were no complications and blood loss was 50cc.   Barbee Cough, M.D. [facility] 08/17/2021

## 2021-08-17 NOTE — Anesthesia Preprocedure Evaluation (Signed)
Anesthesia Evaluation  Patient identified by MRN, date of birth, ID band Patient awake    Reviewed: Allergy & Precautions, NPO status , Patient's Chart, lab work & pertinent test results  Airway Mallampati: II  TM Distance: >3 FB Neck ROM: Full    Dental no notable dental hx.    Pulmonary neg pulmonary ROS, Current Smoker and Patient abstained from smoking.,    Pulmonary exam normal breath sounds clear to auscultation       Cardiovascular negative cardio ROS Normal cardiovascular exam Rhythm:Regular Rate:Normal     Neuro/Psych Anxiety negative neurological ROS  negative psych ROS   GI/Hepatic Neg liver ROS, GERD  ,  Endo/Other  negative endocrine ROS  Renal/GU negative Renal ROS  negative genitourinary   Musculoskeletal  (+) Arthritis , Osteoarthritis,    Abdominal   Peds negative pediatric ROS (+)  Hematology negative hematology ROS (+)   Anesthesia Other Findings   Reproductive/Obstetrics negative OB ROS                             Anesthesia Physical Anesthesia Plan  ASA: 2  Anesthesia Plan: General   Post-op Pain Management:    Induction: Intravenous  PONV Risk Score and Plan: 1 and Ondansetron and Treatment may vary due to age or medical condition  Airway Management Planned: Oral ETT  Additional Equipment:   Intra-op Plan:   Post-operative Plan: Extubation in OR  Informed Consent: I have reviewed the patients History and Physical, chart, labs and discussed the procedure including the risks, benefits and alternatives for the proposed anesthesia with the patient or authorized representative who has indicated his/her understanding and acceptance.     Dental advisory given  Plan Discussed with: CRNA  Anesthesia Plan Comments:         Anesthesia Quick Evaluation

## 2021-08-17 NOTE — Anesthesia Procedure Notes (Signed)
Procedure Name: Intubation Date/Time: 08/17/2021 8:16 AM Performed by: Tawni Millers, CRNA Pre-anesthesia Checklist: Patient identified, Emergency Drugs available, Suction available and Patient being monitored Patient Re-evaluated:Patient Re-evaluated prior to induction Oxygen Delivery Method: Circle system utilized Preoxygenation: Pre-oxygenation with 100% oxygen Induction Type: IV induction Ventilation: Mask ventilation without difficulty Laryngoscope Size: Mac Grade View: Grade I Tube type: Oral Tube size: 7.5 mm Number of attempts: 1 Airway Equipment and Method: Stylet and Oral airway Placement Confirmation: ETT inserted through vocal cords under direct vision, positive ETCO2 and breath sounds checked- equal and bilateral Tube secured with: Tape Dental Injury: Teeth and Oropharynx as per pre-operative assessment

## 2021-08-18 ENCOUNTER — Encounter (HOSPITAL_BASED_OUTPATIENT_CLINIC_OR_DEPARTMENT_OTHER): Payer: Self-pay | Admitting: Otolaryngology

## 2022-10-06 ENCOUNTER — Ambulatory Visit: Payer: Commercial Managed Care - PPO | Admitting: Internal Medicine

## 2022-10-06 ENCOUNTER — Encounter: Payer: Self-pay | Admitting: Internal Medicine

## 2022-10-06 VITALS — BP 120/82 | HR 80 | Resp 16 | Ht 72.0 in | Wt 171.2 lb

## 2022-10-06 DIAGNOSIS — K219 Gastro-esophageal reflux disease without esophagitis: Secondary | ICD-10-CM

## 2022-10-06 DIAGNOSIS — J310 Chronic rhinitis: Secondary | ICD-10-CM | POA: Diagnosis not present

## 2022-10-06 DIAGNOSIS — J342 Deviated nasal septum: Secondary | ICD-10-CM | POA: Diagnosis not present

## 2022-10-06 MED ORDER — CARBINOXAMINE MALEATE 4 MG PO TABS: 4.0000 mg | ORAL_TABLET | Freq: Three times a day (TID) | ORAL | 5 refills | Status: DC

## 2022-10-06 MED ORDER — AZELASTINE-FLUTICASONE 137-50 MCG/ACT NA SUSP: 2.0000 | NASAL | 5 refills | Status: DC

## 2022-10-06 NOTE — Progress Notes (Unsigned)
New Patient Note  RE: Peter Curtis MRN: [id number] DOB: [birth year] Date of Office Visit: 10/06/2022  Consult requested by: Candida Peeling, PA-C Primary care provider: Guadlupe Spanish, MD  Chief Complaint: Allergies  History of Present Illness: I had the pleasure of seeing Peter Curtis for initial evaluation at the [facility] on 10/09/2022. He is a 57 y.o. male, who is referred here by Guadlupe Spanish, MD for the evaluation of chronic rhinitis .  History obtained from patient, chart review.  Chronic rhinitis: started a few years ago  Symptoms include: nasal congestion, rhinorrhea, post nasal drainage, and sneezing he has lost 28 lbs due to nausea from post nasal drip  Occurs  episodically  Potential triggers: grass exposure  Treatments tried: steroid injections, antibiotics, prednisone, claritin-D, previously on nose sprays but stopped because he felt like he was using it too much.  Cannot recall which one  Previous allergy testing: yes 08/2022: skin testing done by [facility] which was negative to all environmentals  History of reflux/heartburn:  YES on nexium 40mg  every morning, still with rare breakthrough symptoms  - He has been referred to GI with plan to EGD and Colonoscopy next week  History of chronic sinusitis or sinus surgery:  deviated septal repair 09/2021  Nonallergic triggers:  cold air     CT SINUS: 06/2022 IMPRESSION:  Normally aerated paranasal sinuses. Patent sinus drainage pathways.   CT SINU 07/2021: IMPRESSION:  Trace mucosal thickening within the bilateral ethmoid and maxillary  sinuses.  The paranasal sinuses are otherwise normally aerated. Patent sinus  drainage pathways.  Rightward deviation and rightward bony spurring of the nasal septum.  Minimal mucosal thickening within the bilateral nasal passages.    Assessment and Plan: Peter Curtis is a 57 y.o. male with: Chronic rhinitis - Plan: Allergens w/Total IgE Area 2  Deviated  septum  Gastroesophageal reflux disease without esophagitis   Plan: Patient Instructions  Chronic Rhinitis  Not well controlled : - Will get blood work to reevaluate allergies   - Prevention:  - allergen avoidance when possible - consider allergy shots as long term control of your symptoms by teaching your immune system to be more tolerant of your allergy triggers  - Symptom control: - Start  Dymista   1 sprays in each nostril twice a day:  Use daily  - Start Atrovent (Ipratropium Bromide) 1-2 sprays in each nostril up to 3 times a day as needed for runny nose/post nasal drip/drainage.   - Use less frequently if airway gets too dry. - Continue Singulair (Montelukast) 10mg  nightly.   - Discontinue if nightmares of behavior changes. - Start carbinoxamine 4mg  2 times a day, can increase up to 3 times a day   Follow up: 2 months   Thank you so much for letting me partake in your care today.  Don't hesitate to reach out if you have any additional concerns!  Roney Marion, MD  [facility] [place] [place]    Meds ordered this encounter  Medications   Azelastine-Fluticasone (DYMISTA) 137-50 MCG/ACT SUSP    Sig: Place 2 sprays into both nostrils 1 day or 1 dose.    Dispense:  23 g    Refill:  5   Carbinoxamine Maleate 4 MG TABS    Sig: Take 1 tablet (4 mg total) by mouth in the morning, at noon, and at bedtime.    Dispense:  28 tablet    Refill:  5  Lab Orders         Allergens w/Total IgE Area 2      Other allergy screening: Asthma: no Rhino conjunctivitis: yes Food allergy: no Medication allergy: no Hymenoptera allergy: no Urticaria: no Eczema:no History of recurrent infections suggestive of immunodeficency: no  Diagnostics: None done    Past Medical History: Patient Active Problem List   Diagnosis Date Noted   Deviated septum 08/17/2021   Past Medical History:  Diagnosis Date   Anxiety    Arthritis    GERD (gastroesophageal reflux  disease)    High cholesterol    Restless leg syndrome    Seasonal allergies    Past Surgical History: Past Surgical History:  Procedure Laterality Date   NASAL SEPTOPLASTY W/ TURBINOPLASTY Bilateral 08/17/2021   Procedure: NASAL SEPTOPLASTY WITH BILATERAL TURBINATE REDUCTION;  Surgeon: Jerrell Belfast, MD;  Location: [facility];  Service: ENT;  Laterality: Bilateral;   Medication List:  Current Outpatient Medications  Medication Sig Dispense Refill   amitriptyline (ELAVIL) 10 MG tablet Take 10 mg by mouth.     Azelastine-Fluticasone (DYMISTA) 137-50 MCG/ACT SUSP Place 2 sprays into both nostrils 1 day or 1 dose. 23 g 5   Carbinoxamine Maleate 4 MG TABS Take 1 tablet (4 mg total) by mouth in the morning, at noon, and at bedtime. 28 tablet 5   celecoxib (CELEBREX) 200 MG capsule Take 200 mg by mouth daily.     Esomeprazole Magnesium (NEXIUM PO) Take 1 tablet by mouth. OTC     gabapentin (NEURONTIN) 600 MG tablet Take 600 mg by mouth 2 (two) times daily.     Multiple Vitamins-Minerals (VITAMIN D3 COMPLETE PO) Take by mouth.     Pitavastatin Calcium (LIVALO) 2 MG TABS daily.     No current facility-administered medications for this visit.   Allergies: No Known Allergies Social History: Social History   Socioeconomic History   Marital status: Married    Spouse name: Not on file   Number of children: Not on file   Years of education: Not on file   Highest education level: Not on file  Occupational History   Not on file  Tobacco Use   Smoking status: Every Day    Packs/day: .5    Types: Cigarettes   Smokeless tobacco: Never  Vaping Use   Vaping Use: Never used  Substance and Sexual Activity   Alcohol use: Yes   Drug use: No   Sexual activity: Not on file  Other Topics Concern   Not on file  Social History Narrative   Not on file   Social Determinants of Health   Financial Resource Strain: Not on file  Food Insecurity: Not on file  Transportation  Needs: Not on file  Physical Activity: Not on file  Stress: Not on file  Social Connections: Not on file   Lives in a [facility] that is 58 years old, no roaches in the house and bed is 2 feet off the floor.  Exposure to dust and chemicals at his job. There is a HEPA filter in the home and [place].   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 No pertinent family history.   ROS: All others negative except as noted per  HPI.   Objective: BP 120/82   Pulse 80   Resp 16   Ht 6' (1.829 m)   Wt 171 lb 3.2 oz (77.7 kg)   SpO2 97%   BMI 23.22 kg/m  Body mass index is 23.22 kg/m.  General Appearance:  Alert, cooperative, no distress, appears stated age  Head:  Normocephalic, without obvious abnormality, atraumatic  Eyes:  Conjunctiva clear, EOM's intact  Nose: Nares normal,  erythematous nasal mucosa , hypertrophic turbinates, no visible anterior polyps, and septum midline  Throat: Lips, tongue normal; teeth and gums normal, + cobblestoning  Neck: Supple, symmetrical  Lungs:   clear to auscultation bilaterally, Respirations unlabored, no coughing  Heart:  regular rate and rhythm and no murmur, Appears well perfused  Extremities: No edema  Skin: Skin color, texture, turgor normal, no rashes or lesions on visualized portions of skin  Neurologic: No gross deficits   The plan was reviewed with the patient/family, and all questions/concerned were addressed.  It was my pleasure to see Peter Curtis today and participate in his care. Please feel free to contact me with any questions or concerns.  Sincerely,  Roney Marion, MD Allergy & Immunology  [facility] office:  [phone] [facility] office: [phone]

## 2022-10-06 NOTE — Patient Instructions (Signed)
Chronic Rhinitis  Not well controlled : - Will get blood work to reevaluate allergies   - Prevention:  - allergen avoidance when possible - consider allergy shots as long term control of your symptoms by teaching your immune system to be more tolerant of your allergy triggers  - Symptom control: - Start  Dymista   1 sprays in each nostril twice a day:  Use daily  - Start Atrovent (Ipratropium Bromide) 1-2 sprays in each nostril up to 3 times a day as needed for runny nose/post nasal drip/drainage.   - Use less frequently if airway gets too dry. - Continue Singulair (Montelukast) 10mg  nightly.   - Discontinue if nightmares of behavior changes. - Start carbinoxamine 4mg  2 times a day, can increase up to 3 times a day   Follow up: 2 months   Thank you so much for letting me partake in your care today.  Don't hesitate to reach out if you have any additional concerns!  Roney Marion, MD  [facility] [place] [place]

## 2022-10-09 MED ORDER — IPRATROPIUM BROMIDE 0.06 % NA SOLN: 2.0000 | Freq: Three times a day (TID) | NASAL | 12 refills | Status: AC

## 2022-10-09 NOTE — Addendum Note (Signed)
Addended by: Carin Hock on: 10/09/2022 04:22 PM   Modules accepted: Orders

## 2022-10-11 LAB — ALLERGENS W/TOTAL IGE AREA 2

## 2022-10-11 NOTE — Progress Notes (Signed)
Blood work was negative to all environmentals.  He has non allergic causes for his symptoms.  Lets continue current treatment plan and we can discuss further at follow up.  Can someone let patient know?  Thanks!

## 2022-12-01 ENCOUNTER — Ambulatory Visit: Payer: Commercial Managed Care - PPO | Admitting: Internal Medicine

## 2022-12-01 ENCOUNTER — Encounter: Payer: Self-pay | Admitting: Internal Medicine

## 2022-12-01 VITALS — BP 118/78 | HR 76 | Resp 16

## 2022-12-01 DIAGNOSIS — K219 Gastro-esophageal reflux disease without esophagitis: Secondary | ICD-10-CM

## 2022-12-01 DIAGNOSIS — H1045 Other chronic allergic conjunctivitis: Secondary | ICD-10-CM

## 2022-12-01 DIAGNOSIS — J342 Deviated nasal septum: Secondary | ICD-10-CM

## 2022-12-01 DIAGNOSIS — J310 Chronic rhinitis: Secondary | ICD-10-CM | POA: Diagnosis not present

## 2022-12-01 NOTE — Patient Instructions (Addendum)
Chronic Rhinitis  Not well controlled : - Blood work was negative for environmental allergies   - Prevention:  - allergen avoidance   - Symptom control: - Continue  Dymista   1 sprays in each nostril twice a day:  Use daily  - Continue Atrovent (Ipratropium Bromide) 1-2 sprays in each nostril up to 3 times a day as needed for runny nose/post nasal drip/drainage.   - Continue carbinoxamine 4mg  2 times a day, can increase up to 3 times a day   Allergic Conjunctivitis - Continue Allergen avoidance as instructed - Avoiding rubbing eyes, if irritated use a wet wash cloth to wipe allergen out of eyes  -Continue allergy Eye drops: great options include Pataday (Olopatadine) or Zaditor (ketotifen) for eye symptoms daily as needed-both sold over the counter if not covered by insurance.   -Avoid eye drops that say red eye relief as they may contain medications that dry out your eyes. - Consider allergen immunotherapy if symptoms worsen or you desire to reduce lifetime use of medications.    Follow up: 6 months   Thank you so much for letting me partake in your care today.  Don't hesitate to reach out if you have any additional concerns!  Ferol Luz, MD  [facility] [place] [place]

## 2022-12-01 NOTE — Progress Notes (Signed)
Follow Up Note  RE: Peter Curtis MRN: [id number] DOB: 06-Jun-1966 Date of Office Visit: 12/01/2022  Referring provider: Karle Plumber, MD Primary care provider: Karle Plumber, MD  Chief Complaint: Allergies  History of Present Illness: I had the pleasure of seeing Peter Curtis for a follow up visit [facility] on 12/04/2022. He is a 57 y.o. male, who is being followed for chronic rhinitis. His previous allergy office visit was on 10/06/22 with Dr. Marlynn Perking. Today is a regular follow up visit.  History obtained from patient, chart review.  Since last visit he reports significant improvement in nasal congestion, rhinorrhea, red itchy eyes.  Tolerating carbinoxamine twice a day without any sedation.  Using Atrovent and Dymista as needed for any breakthrough symptoms.  Did have mild ocular itching a few days ago but did not use his allergy eyedrops.  Overall he is very happy with current level control and denies any adverse effects of medication.  GERD is well-controlled denies any breakthrough symptoms.  Assessment and Plan: Peter Curtis is a 57 y.o. male with: Chronic rhinitis  Deviated septum  Gastroesophageal reflux disease without esophagitis  Other chronic allergic conjunctivitis of both eyes   Plan: Patient Instructions  Chronic Rhinitis  Not well controlled : - Blood work was negative for environmental allergies   - Prevention:  - allergen avoidance   - Symptom control: - Continue  Dymista   1 sprays in each nostril twice a day:  Use daily  - Continue Atrovent (Ipratropium Bromide) 1-2 sprays in each nostril up to 3 times a day as needed for runny nose/post nasal drip/drainage.   - Continue carbinoxamine 4mg  2 times a day, can increase up to 3 times a day   Allergic Conjunctivitis - Continue Allergen avoidance as instructed - Avoiding rubbing eyes, if irritated use a wet wash cloth to wipe allergen out of eyes  -Continue allergy Eye drops:  great options include Pataday (Olopatadine) or Zaditor (ketotifen) for eye symptoms daily as needed-both sold over the counter if not covered by insurance.   -Avoid eye drops that say red eye relief as they may contain medications that dry out your eyes. - Consider allergen immunotherapy if symptoms worsen or you desire to reduce lifetime use of medications.    Follow up: 6 months   Thank you so much for letting me partake in your care today.  Don't hesitate to reach out if you have any additional concerns!  Ferol Luz, MD  [facility] [place] [place]    No orders of the defined types were placed in this encounter.   Lab Orders  No laboratory test(s) ordered today   Diagnostics: None done   Medication List:  Current Outpatient Medications  Medication Sig Dispense Refill   amitriptyline (ELAVIL) 10 MG tablet Take 10 mg by mouth.     Azelastine-Fluticasone (DYMISTA) 137-50 MCG/ACT SUSP Place 2 sprays into both nostrils 1 day or 1 dose. 23 g 5   Carbinoxamine Maleate 4 MG TABS Take 1 tablet (4 mg total) by mouth in the morning, at noon, and at bedtime. 28 tablet 5   celecoxib (CELEBREX) 200 MG capsule Take 200 mg by mouth daily.     Esomeprazole Magnesium (NEXIUM PO) Take 1 tablet by mouth. OTC     gabapentin (NEURONTIN) 600 MG tablet Take 600 mg by mouth 2 (two) times daily.     ipratropium (ATROVENT) 0.06 % nasal spray Place 2 sprays into  both nostrils 3 (three) times daily. 15 mL 12   Multiple Vitamins-Minerals (VITAMIN D3 COMPLETE PO) Take by mouth.     Pitavastatin Calcium (LIVALO) 2 MG TABS daily.     No current facility-administered medications for this visit.   Allergies: No Known Allergies I reviewed his past medical history, social history, family history, and environmental history and no significant changes have been reported from his previous visit.  ROS: All others negative except as noted per HPI.   Objective: BP 118/78   Pulse 76   Resp  16   SpO2 96%  There is no height or weight on file to calculate BMI. General Appearance:  Alert, cooperative, no distress, appears stated age  Head:  Normocephalic, without obvious abnormality, atraumatic  Eyes:  Conjunctiva clear, EOM's intact  Nose: Nares normal, normal mucosa, no visible anterior polyps, and septum midline  Throat: Lips, tongue normal; teeth and gums normal, normal posterior oropharynx  Neck: Supple, symmetrical  Lungs:   clear to auscultation bilaterally, Respirations unlabored, no coughing  Heart:  regular rate and rhythm and no murmur, Appears well perfused  Extremities: No edema  Skin: Skin color, texture, turgor normal, no rashes or lesions on visualized portions of skin  Neurologic: No gross deficits   Previous notes and tests were reviewed. The plan was reviewed with the patient/family, and all questions/concerned were addressed.  It was my pleasure to see Peter Curtis today and participate in his care. Please feel free to contact me with any questions or concerns.  Sincerely,  Ferol Luz, MD  Allergy & Immunology  [facility] Office: [phone]

## 2022-12-26 ENCOUNTER — Other Ambulatory Visit: Payer: Self-pay | Admitting: *Deleted

## 2022-12-26 MED ORDER — CARBINOXAMINE MALEATE 4 MG PO TABS: ORAL_TABLET | ORAL | 5 refills | Status: DC

## 2023-01-01 ENCOUNTER — Other Ambulatory Visit: Payer: Self-pay

## 2023-01-01 MED ORDER — CARBINOXAMINE MALEATE 4 MG PO TABS: ORAL_TABLET | ORAL | 4 refills | Status: DC

## 2023-08-16 ENCOUNTER — Other Ambulatory Visit: Payer: Self-pay | Admitting: Internal Medicine

## 2023-08-21 ENCOUNTER — Other Ambulatory Visit: Payer: Self-pay | Admitting: Internal Medicine

## 2023-08-21 NOTE — Telephone Encounter (Signed)
Refill for Azselastine-Fluticasone x 1 with 3 refills at [facility].

## 2023-08-24 ENCOUNTER — Ambulatory Visit: Payer: Commercial Managed Care - PPO | Admitting: Internal Medicine

## 2023-08-24 ENCOUNTER — Encounter: Payer: Self-pay | Admitting: Internal Medicine

## 2023-08-24 VITALS — BP 130/82 | HR 88 | Resp 16

## 2023-08-24 DIAGNOSIS — H6504 Acute serous otitis media, recurrent, right ear: Secondary | ICD-10-CM | POA: Diagnosis not present

## 2023-08-24 DIAGNOSIS — J342 Deviated nasal septum: Secondary | ICD-10-CM

## 2023-08-24 DIAGNOSIS — H1045 Other chronic allergic conjunctivitis: Secondary | ICD-10-CM | POA: Diagnosis not present

## 2023-08-24 DIAGNOSIS — K219 Gastro-esophageal reflux disease without esophagitis: Secondary | ICD-10-CM | POA: Diagnosis not present

## 2023-08-24 MED ORDER — AZELASTINE-FLUTICASONE 137-50 MCG/ACT NA SUSP: NASAL | 6 refills | Status: DC

## 2023-08-24 MED ORDER — DOXYCYCLINE HYCLATE 100 MG PO TABS: 100.0000 mg | ORAL_TABLET | Freq: Two times a day (BID) | ORAL | 0 refills | Status: AC

## 2023-08-24 NOTE — Progress Notes (Signed)
Follow Up Note  RE: Peter Curtis MRN: [id number] DOB: 19-Oct-1965 Date of Office Visit: 08/24/2023  Referring provider: Karle Plumber, MD Primary care provider: Karle Plumber, MD  Chief Complaint: Allergic Rhinitis  and Sinus Problem  History of Present Illness: I had the pleasure of seeing Peter Curtis for a follow up visit at the [facility] on 08/24/2023. He is a 58 y.o. male, who is being followed for chronic rhinitis. His previous allergy office visit was on 12/01/22 with Dr. Marlynn Perking. Today is a  acute visit for sinus infection  .  History obtained from patient, chart review.  He reports 3 weeks of yellow rhinnorrhea, nasal congestion , PND and sinus pressure.  He saw UC and was treated with augmentin for 5 days.  Without good reponse.  Saw pcp who treated him with depo medrol 40mg  IM, rocephin and z pack with some improvement. Nw with worsening ear pain, headache and rhinnorhea.  He has been compliant with dymista, atrovent and carbinoxamine.  Previous nonallergic symptoms were well controlled with this regimen until the past few week.     GERD is well-controlled denies any breakthrough symptoms  Assessment and Plan: Peter Curtis is a 57 y.o. male with: Recurrent acute serous otitis media of right ear  Deviated septum  Gastroesophageal reflux disease without esophagitis  Other chronic allergic conjunctivitis of both eyes   Plan: Patient Instructions  Chronic Rhinitis  Not well controlled  with sinusitis flare  - Blood work was negative for environmental allergies  - Start doxycycline 100 mg twice daily for 7 days   - Symptom control: - Continue  Dymista   1 sprays in each nostril twice a day:  Use daily  - Continue Atrovent (Ipratropium Bromide) 1-2 sprays in each nostril up to 3 times a day as needed for runny nose/post nasal drip/drainage.   - Continue carbinoxamine 4mg  2 times a day, can increase up to 3 times a day   Allergic Conjunctivitis -  Continue Allergen avoidance as instructed - Avoiding rubbing eyes, if irritated use a wet wash cloth to wipe allergen out of eyes  -Continue allergy Eye drops: great options include Pataday (Olopatadine) or Zaditor (ketotifen) for eye symptoms daily as needed-both sold over the counter if not covered by insurance.   -Avoid eye drops that say red eye relief as they may contain medications that dry out your eyes. - Consider allergen immunotherapy if symptoms worsen or you desire to reduce lifetime use of medications.    Follow up: 6 months   Thank you so much for letting me partake in your care today.  Don't hesitate to reach out if you have any additional concerns!  Ferol Luz, MD  [facility] [place] [place]   Meds ordered this encounter  Medications   Azelastine-Fluticasone 137-50 MCG/ACT SUSP    Sig: Use 2 spray(s) in each nostril once daily    Dispense:  23 g    Refill:  6   doxycycline (VIBRA-TABS) 100 MG tablet    Sig: Take 1 tablet (100 mg total) by mouth 2 (two) times daily for 7 days.    Dispense:  14 tablet    Refill:  0    Lab Orders  No laboratory test(s) ordered today   Diagnostics: None done   Medication List:  Current Outpatient Medications  Medication Sig Dispense Refill   amitriptyline (ELAVIL) 10 MG tablet Take 10 mg by mouth.     Carbinoxamine  Maleate 4 MG TABS Take 1 tablet 2-3 times daily 180 tablet 4   celecoxib (CELEBREX) 200 MG capsule Take 200 mg by mouth daily.     doxycycline (VIBRA-TABS) 100 MG tablet Take 1 tablet (100 mg total) by mouth 2 (two) times daily for 7 days. 14 tablet 0   Esomeprazole Magnesium (NEXIUM PO) Take 1 tablet by mouth. OTC     gabapentin (NEURONTIN) 600 MG tablet Take 600 mg by mouth 2 (two) times daily.     ipratropium (ATROVENT) 0.06 % nasal spray Place 2 sprays into both nostrils 3 (three) times daily. 15 mL 12   Pitavastatin Calcium (LIVALO) 2 MG TABS daily.     Azelastine-Fluticasone 137-50 MCG/ACT  SUSP Use 2 spray(s) in each nostril once daily 23 g 6   No current facility-administered medications for this visit.   Allergies: No Known Allergies I reviewed his past medical history, social history, family history, and environmental history and no significant changes have been reported from his previous visit.  ROS: All others negative except as noted per HPI.   Objective: BP 130/82   Pulse 88   Resp 16   SpO2 96%  There is no height or weight on file to calculate BMI. General Appearance:  Alert, cooperative, no distress, appears stated age  Head:  Normocephalic, without obvious abnormality, atraumatic  Ears/ Eyes:  Conjunctiva clear, EOM's intact, R TM- bulging, L -TM normal   Nose: Nares normal,  erythematous nasal mucosa with clear yellow rhinnorhea, hypertrophic turbinates, no visible anterior polyps, and septum midline  Throat: Lips, tongue normal; teeth and gums normal, normal posterior oropharynx  Neck: Supple, symmetrical  Lungs:   clear to auscultation bilaterally, Respirations unlabored, no coughing  Heart:  regular rate and rhythm and no murmur, Appears well perfused  Extremities: No edema  Skin: Skin color, texture, turgor normal, no rashes or lesions on visualized portions of skin  Neurologic: No gross deficits   Previous notes and tests were reviewed. The plan was reviewed with the patient/family, and all questions/concerned were addressed.  It was my pleasure to see [name] today and participate in his care. Please feel free to contact me with any questions or concerns.  Sincerely,  Ferol Luz, MD  Allergy & Immunology  [facility] Office: [phone]

## 2023-08-24 NOTE — Patient Instructions (Addendum)
Chronic Rhinitis  Not well controlled  with sinusitis flare  - Blood work was negative for environmental allergies  - Start doxycycline 100 mg twice daily for 7 days   - Symptom control: - Continue  Dymista   1 sprays in each nostril twice a day:  Use daily  - Continue Atrovent (Ipratropium Bromide) 1-2 sprays in each nostril up to 3 times a day as needed for runny nose/post nasal drip/drainage.   - Continue carbinoxamine 4mg  2 times a day, can increase up to 3 times a day   Allergic Conjunctivitis - Continue Allergen avoidance as instructed - Avoiding rubbing eyes, if irritated use a wet wash cloth to wipe allergen out of eyes  -Continue allergy Eye drops: great options include Pataday (Olopatadine) or Zaditor (ketotifen) for eye symptoms daily as needed-both sold over the counter if not covered by insurance.   -Avoid eye drops that say red eye relief as they may contain medications that dry out your eyes. - Consider allergen immunotherapy if symptoms worsen or you desire to reduce lifetime use of medications.    Follow up: 6 months   Thank you so much for letting me partake in your care today.  Don't hesitate to reach out if you have any additional concerns!  Ferol Luz, MD  [facility] [place], [place]

## 2023-09-03 ENCOUNTER — Telehealth: Payer: Self-pay | Admitting: Internal Medicine

## 2023-09-03 NOTE — Telephone Encounter (Signed)
 Please advise if you would like to send in Prednisone  for Russ Course.

## 2023-09-03 NOTE — Telephone Encounter (Signed)
 Patient states he finished Doxycycline  and felt a little better but seems to be flaring up again. Wife mentioned Dr.Lomasney would send Prednisone  if the antibiotic didn't work.

## 2023-09-04 ENCOUNTER — Other Ambulatory Visit: Payer: Self-pay | Admitting: *Deleted

## 2023-09-04 MED ORDER — PREDNISONE 10 MG PO TABS: ORAL_TABLET | ORAL | 0 refills | Status: DC

## 2023-09-04 NOTE — Telephone Encounter (Signed)
Verdis informed and rx sent. He will call back if not better.

## 2023-09-04 NOTE — Telephone Encounter (Signed)
We can do one course of prednisone, but if he continues to have symptoms after this he will need to get a sinus CT to evaluate for chronic sinusitis.    Prednisone 10mg  twice daily for 5 days.  Thanks!

## 2023-10-30 ENCOUNTER — Other Ambulatory Visit: Payer: Self-pay

## 2023-10-30 ENCOUNTER — Ambulatory Visit: Admitting: Internal Medicine

## 2023-10-30 VITALS — BP 126/84 | HR 69 | Temp 98.1°F | Resp 18

## 2023-10-30 DIAGNOSIS — B999 Unspecified infectious disease: Secondary | ICD-10-CM | POA: Diagnosis not present

## 2023-10-30 DIAGNOSIS — R110 Nausea: Secondary | ICD-10-CM | POA: Diagnosis not present

## 2023-10-30 MED ORDER — AMOXICILLIN-POT CLAVULANATE 875-125 MG PO TABS: 1.0000 | ORAL_TABLET | Freq: Two times a day (BID) | ORAL | 0 refills | Status: AC

## 2023-10-30 NOTE — Patient Instructions (Addendum)
 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   - Continue carbinoxamine 4mg  2 times a day, can increase up to 3 times a day   Allergic Conjunctivitis - Continue Allergen avoidance as instructed - Avoiding rubbing eyes, if irritated use a wet wash cloth to wipe allergen out of eyes  -Continue allergy Eye drops: great options include Pataday (Olopatadine) or Zaditor (ketotifen) for eye symptoms daily as needed-both sold over the counter if not covered by insurance.   -Avoid eye drops that say red eye relief as they may contain medications that dry out your eyes. - Consider allergen immunotherapy if symptoms worsen or you desire to reduce lifetime use of medications.     Night sweats and nausea associated with red meat ingestion  - We will get blood work to evaluate for alpha gal - Avoid all red meat until labs result  Keep previously scheduled follow-up with me We will call you with lab results and next  Thank you so much for letting me partake in your care today.  Don't hesitate to reach out if you have any additional concerns!  Ferol Luz, MD  [facility] [place], [place]

## 2023-10-30 NOTE — Progress Notes (Unsigned)
 FOLLOW UP Date of Service/Encounter:  11/02/23  Subjective:  Peter Curtis (DOB: April 05, 1966) is a 58 y.o. male who [facility] in re-evaluation of the following: chronic rhinitis/sinusitis  History obtained from: chart review and patient.  For Review, LV was on 08/24/23  with Dr. Marlynn Perking seen for acute visit for recurrent ear infection  . See below for summary of history and diagnostics.   Therapeutic plans/changes recommended: Treated with doxycycline for sinusitis flare ----------------------------------------------------- --------------------------------------------------- Today presents for follow-up. Discussed the use of AI scribe software for clinical note transcription with the patient, who gave verbal consent to proceed.  History of Present Illness Peter Curtis is a 58 year old male who presents with fatigue, nausea, and night sweats. He is accompanied by his wife.  He experiences significant fatigue, nausea, and night sweats. The fatigue is overwhelming, making him feel as though he could 'lay down anywhere'. Despite taking Phenergan, nausea persists, particularly in the mornings, accompanied by a lack of appetite. He ate his first meal yesterday since Friday. Night sweats are severe, causing him to wake up feeling 'burning up inside' and 'real, real clammy'. His wife observes him being 'soaking wet' in the mornings, necessitating changes of bed sheets. Despite feeling hot, there is no fever.  He has a history of sinus issues, having undergone surgery and subsequent CT scans which showed clear sinus cavities. Despite this, he continues to experience significant nasal drainage, particularly in the mornings, which he manages with a steroid nasal spray. He has run out of ipratropium, which he used up to four times a day, and has been taking carbinoxamine three times a day. He reports sharp pains in both ears and a sensation of fullness in his  head. He has a history of fluid in his ears, previously treated with antibiotics and prednisone.  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 Certain foods, particularly red meat and specific pizza brands, exacerbate his symptoms, causing night sweats and gastrointestinal discomfort. He manages his pigweed allergy by wearing a mask while mowing his lawn. No significant changes in breathing or chest pain, but notes a sensation of restricted airflow due to head congestion.     All medications reviewed by clinical staff and updated in chart. No new pertinent medical or surgical history except as noted in HPI.  ROS: All others negative except as noted per HPI.   Objective:  BP 126/84   Pulse 69   Temp 98.1 F (36.7 C) (Temporal)   Resp 18   SpO2 96%  There is no height or weight on file to calculate BMI. Physical Exam: General Appearance:  Alert, cooperative, no distress, appears stated age  Head:  Normocephalic, without obvious abnormality, atraumatic  Eyes:  Conjunctiva clear, EOM's intact  Ears Right TM- erythematous and bulging,  and EACs normal bilaterally  Nose: Nares normal,  erythematous nasal mucosa , hypertrophic turbinates, no visible anterior polyps, and septum midline  Throat: Lips, tongue normal; teeth and gums normal, normal posterior oropharynx  Neck: Supple, symmetrical  Lungs:   clear to auscultation bilaterally, Respirations unlabored, no coughing  Heart:  regular rate and rhythm and no murmur, Appears well perfused  Extremities: No edema  Skin: Skin color, texture, turgor normal and no rashes or lesions on visualized portions of skin  Neurologic: No gross deficits   Labs:  Lab Orders  Strep pneumoniae 23 Serotypes IgG         Immunoglobulins, QN, A/E/G/M         CBC With Diff/Platelet         Alpha-Gal Panel      Assessment/Plan     Chronic rhinosinusitis with  recurrent ear infections + ETD - Start augmentin 875/125mg  twice daily for 7 days  - We will get labs to evaluate your immune system to see if there is a reason you are getting sick    - Symptom control: - Continue  Dymista   1 sprays in each nostril twice a day:  Use daily  - Continue Atrovent (Ipratropium Bromide) 1-2 sprays in each nostril up to 3 times a day as needed for runny nose/post nasal drip/drainage.   - Continue carbinoxamine 4mg  2 times a day, can increase up to 3 times a day   Allergic Conjunctivitis - Continue Allergen avoidance as instructed - Avoiding rubbing eyes, if irritated use a wet wash cloth to wipe allergen out of eyes  -Continue allergy Eye drops: great options include Pataday (Olopatadine) or Zaditor (ketotifen) for eye symptoms daily as needed-both sold over the counter if not covered by insurance.   -Avoid eye drops that say red eye relief as they may contain medications that dry out your eyes. - Consider allergen immunotherapy if symptoms worsen or you desire to reduce lifetime use of medications.     Night sweats and nausea associated with red meat ingestion  - We will get blood work to evaluate for alpha gal - Avoid all red meat until labs result  Keep previously scheduled follow-up with me We will call you with lab results and next  Thank you so much for letting me partake in your care today.  Don't hesitate to reach out if you have any additional concerns!  Ferol Luz, MD  [facility] [place] [place]   Other:     Thank you so much for letting me partake in your care today.  Don't hesitate to reach out if you have any additional concerns!  Ferol Luz, MD  [facility] [place], [place]

## 2023-10-31 ENCOUNTER — Telehealth: Payer: Self-pay

## 2023-10-31 NOTE — Telephone Encounter (Signed)
 Patients spouce Vernona Rieger called regarding his nasal sprays sent in to pharmacy she states they could not get atrovent. I called pharmacy [phone] spoke to tec Boykin Reaper and Eda they state they had old pharmacy but will update to new one from 10/30/2023 and the dimista was to early to fill. I called patient and he is aware.

## 2023-11-05 ENCOUNTER — Telehealth: Payer: Self-pay | Admitting: *Deleted

## 2023-11-05 DIAGNOSIS — B999 Unspecified infectious disease: Secondary | ICD-10-CM

## 2023-11-05 NOTE — Telephone Encounter (Signed)
 Dr. Jolayne Natter is out of the office returning next week. His labs look okay except he does not have adequate strep titers, likely because he has not had pneumovax 23 vaccine (typically given at age 59 and older). Would recommend he get that and repeat titers in 6 weeks.  Dr. Jolayne Natter will be in touch once all labs are back.

## 2023-11-05 NOTE — Telephone Encounter (Signed)
 Pt called and could see lab results on his mychart. I explained to him that the doctor will usually wait until everything results to give pt a call. We are still waiting on the alpha gal. He was still curious about that the abnormal results. I did forget that Dr. Jolayne Natter is out this week.

## 2023-11-06 NOTE — Telephone Encounter (Signed)
 Pt informed. mailed post labs. Routed to PCP.

## 2023-11-07 LAB — CBC WITH DIFF/PLATELET
Basophils Absolute: 0.1 10*3/uL (ref 0.0–0.2)
Basos: 1 %
EOS (ABSOLUTE): 0.3 10*3/uL (ref 0.0–0.4)
Eos: 4 %
Hematocrit: 40.9 % (ref 37.5–51.0)
Hemoglobin: 14.2 g/dL (ref 13.0–17.7)
Immature Grans (Abs): 0 10*3/uL (ref 0.0–0.1)
Immature Granulocytes: 0 %
Lymphocytes Absolute: 2.4 10*3/uL (ref 0.7–3.1)
Lymphs: 30 %
MCH: 30.5 pg (ref 26.6–33.0)
MCHC: 34.7 g/dL (ref 31.5–35.7)
MCV: 88 fL (ref 79–97)
Monocytes Absolute: 0.6 10*3/uL (ref 0.1–0.9)
Monocytes: 8 %
Neutrophils Absolute: 4.8 10*3/uL (ref 1.4–7.0)
Neutrophils: 57 %
Platelets: 280 10*3/uL (ref 150–450)
RBC: 4.65 x10E6/uL (ref 4.14–5.80)
RDW: 12.7 % (ref 11.6–15.4)
WBC: 8.2 10*3/uL (ref 3.4–10.8)

## 2023-11-07 LAB — STREP PNEUMONIAE 23 SEROTYPES IGG
Pneumo Ab Type 1*: 0.6 ug/mL — ABNORMAL LOW (ref >1.3)
Pneumo Ab Type 12 (12F)*: <0.1 ug/mL — ABNORMAL LOW (ref >1.3)
Pneumo Ab Type 14*: 12.6 ug/mL (ref >1.3)
Pneumo Ab Type 17 (17F)*: 1.5 ug/mL (ref >1.3)
Pneumo Ab Type 19 (19F)*: 3.1 ug/mL (ref >1.3)
Pneumo Ab Type 2*: 0.4 ug/mL — ABNORMAL LOW (ref >1.3)
Pneumo Ab Type 20*: 0.3 ug/mL — ABNORMAL LOW (ref >1.3)
Pneumo Ab Type 22 (22F)*: 0.2 ug/mL — ABNORMAL LOW (ref >1.3)
Pneumo Ab Type 23 (23F)*: 0.2 ug/mL — ABNORMAL LOW (ref >1.3)
Pneumo Ab Type 26 (6B)*: 0.4 ug/mL — ABNORMAL LOW (ref >1.3)
Pneumo Ab Type 3*: <0.1 ug/mL — ABNORMAL LOW (ref >1.3)
Pneumo Ab Type 34 (10A)*: <0.1 ug/mL — ABNORMAL LOW (ref >1.3)
Pneumo Ab Type 4*: <0.1 ug/mL — ABNORMAL LOW (ref >1.3)
Pneumo Ab Type 43 (11A)*: 0.2 ug/mL — ABNORMAL LOW (ref >1.3)
Pneumo Ab Type 5*: 1.1 ug/mL — ABNORMAL LOW (ref >1.3)
Pneumo Ab Type 51 (7F)*: 8.5 ug/mL (ref >1.3)
Pneumo Ab Type 54 (15B)*: 0.2 ug/mL — ABNORMAL LOW (ref >1.3)
Pneumo Ab Type 56 (18C)*: 0.2 ug/mL — ABNORMAL LOW (ref >1.3)
Pneumo Ab Type 57 (19A)*: 6 ug/mL (ref >1.3)
Pneumo Ab Type 68 (9V)*: <0.1 ug/mL — ABNORMAL LOW (ref >1.3)
Pneumo Ab Type 70 (33F)*: 4.8 ug/mL (ref >1.3)
Pneumo Ab Type 8*: 2.2 ug/mL (ref >1.3)
Pneumo Ab Type 9 (9N)*: <0.1 ug/mL — ABNORMAL LOW (ref >1.3)

## 2023-11-07 LAB — IMMUNOGLOBULINS A/E/G/M, SERUM
IgA/Immunoglobulin A, Serum: 237 mg/dL (ref 90–386)
IgG (Immunoglobin G), Serum: 874 mg/dL (ref 603–1613)
IgM (Immunoglobulin M), Srm: 90 mg/dL (ref 20–172)

## 2023-11-07 LAB — ALPHA-GAL PANEL: IgE (Immunoglobulin E), Serum: 308 [IU]/mL (ref 6–495)

## 2023-11-26 NOTE — Progress Notes (Signed)
 Immune workup showed nonprotective strep pneumonia titers.  He needs to receive the Pneumovax vaccine and get repeat titers in 6 weeks as part of his immune evaluation.

## 2023-11-28 ENCOUNTER — Encounter: Payer: Self-pay | Admitting: *Deleted

## 2023-11-28 ENCOUNTER — Other Ambulatory Visit: Payer: Self-pay | Admitting: *Deleted

## 2023-11-28 DIAGNOSIS — B999 Unspecified infectious disease: Secondary | ICD-10-CM

## 2023-12-31 ENCOUNTER — Encounter: Payer: Self-pay | Admitting: Internal Medicine

## 2023-12-31 ENCOUNTER — Ambulatory Visit: Admitting: Internal Medicine

## 2023-12-31 VITALS — BP 120/72 | HR 83 | Resp 18

## 2023-12-31 DIAGNOSIS — K219 Gastro-esophageal reflux disease without esophagitis: Secondary | ICD-10-CM

## 2023-12-31 DIAGNOSIS — B999 Unspecified infectious disease: Secondary | ICD-10-CM

## 2023-12-31 DIAGNOSIS — J342 Deviated nasal septum: Secondary | ICD-10-CM | POA: Diagnosis not present

## 2023-12-31 DIAGNOSIS — J310 Chronic rhinitis: Secondary | ICD-10-CM | POA: Diagnosis not present

## 2023-12-31 DIAGNOSIS — R110 Nausea: Secondary | ICD-10-CM

## 2023-12-31 DIAGNOSIS — H1045 Other chronic allergic conjunctivitis: Secondary | ICD-10-CM

## 2023-12-31 MED ORDER — CARBINOXAMINE MALEATE 4 MG PO TABS: 1.0000 | ORAL_TABLET | Freq: Three times a day (TID) | ORAL | 5 refills | Status: AC | PRN

## 2023-12-31 MED ORDER — PREDNISONE 10 MG PO TABS: ORAL_TABLET | ORAL | 0 refills | Status: AC

## 2023-12-31 NOTE — Progress Notes (Signed)
 FOLLOW UP Date of Service/Encounter:  12/31/23  Subjective:  Peter Curtis (DOB: [birth year]) is a 58 y.o. male who [facility] in re-evaluation of the following: chronic rhinitis/sinusitis  History obtained from: chart review and patient.  For Review, LV was on 08/24/23  with Dr. Jolayne Natter seen for acute visit for recurrent ear infection . See below for summary of history and diagnostics.   Therapeutic plans/changes recommended: Treated with doxycycline  for sinusitis flare ----------------------------------------------------- --------------------------------------------------- Today presents for follow-up. Discussed the use of AI scribe software for clinical note transcription with the patient, who gave verbal consent to proceed.  History of Present Illness  Peter Curtis is a 58 year old male with recurrent sinus infections who presents with persistent sinus symptoms despite treatment.  He has been experiencing persistent sinus symptoms despite recent treatment with steroids and antibiotics. On Memorial Day, he received a steroid shot and a 10-day course of Cefdinir, which initially improved his symptoms. However, three days after completing the antibiotics, he experienced a recurrence of symptoms, including nasal drainage, a sensation of ear fullness, and burning in the nasal passages.  He has a history of sinus issues and underwent a septoplasty 1 year ago by Dr. Oliver Betters who is now retired from [facility] E[facility] practice.  A sinus CT performed after the surgery showed clear sinus cavities at that time. Despite this, he continues to experience frequent sinus infections, with more than six episodes in the past year requiring antibiotics. His symptoms return every two weeks, impacting his ability to eat and causing significant nausea due to drainage.  He is currently using nasal sprays as previously prescribed and has been on a regimen of vitamin D and B12  supplements, following a finding of depleted B12 levels. He has received B12 shots weekly for four weeks and is now on a monthly schedule.  He notes a lack of appetite and significant weight loss, as evidenced by his wedding ring becoming loose. He also reports altered taste but no issues with his sense of smell.  He did receive his pneumovax and is due to repeat titers.    All medications reviewed by clinical staff and updated in chart. No new pertinent medical or surgical history except as noted in HPI.  ROS: All others negative except as noted per HPI.   Objective:  BP 120/72   Pulse 83   Resp 18   SpO2 98%  There is no height or weight on file to calculate BMI. Physical Exam: General Appearance:  Alert, cooperative, no distress, appears stated age  Head:  Normocephalic, without obvious abnormality, atraumatic  Eyes:  Conjunctiva clear, EOM's intact  Ears Right TM- erythematous and bulging,  and EACs normal bilaterally  Nose: Nares normal, erythematous nasal mucosa , hypertrophic turbinates, no visible anterior polyps, and septum midline  Throat: Lips, tongue normal; teeth and gums normal, normal posterior oropharynx  Neck: Supple, symmetrical  Lungs:   clear to auscultation bilaterally, Respirations unlabored, no coughing  Heart:  regular rate and rhythm and no murmur, Appears well perfused  Extremities: No edema  Skin: Skin color, texture, turgor normal and no rashes or lesions on visualized portions of skin  Neurologic: No gross deficits   Labs:  Lab Orders         Strep pneumoniae 23 Serotypes IgG       Assessment/Plan     Recurrent infections - Plan: Strep pneumoniae 23 Serotypes IgG  Deviated septum  Gastroesophageal reflux disease  without esophagitis  Chronic rhinitis  Other chronic allergic conjunctivitis of both eyes  Nausea  Patient Instructions  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  - Will get repeat strep pneumoniae titers after vaccination.  If NON protective will recommend replacement immunoglobulin for a diagnosis of specific antibody deficiency  - Records release obtained for prior ENT evaluation including previous sinus Cts  - We will refer you to [facility] - Dr. Lydia Sams   - Symptom control: - Continue Dymista   1 sprays in each nostril twice a day:  Use daily  - Continue Atrovent  (Ipratropium Bromide ) 1-2 sprays in each nostril up to 3 times a day as needed for runny nose/post nasal drip/drainage.   - Continue carbinoxamine  4mg  3 times a day, can increase up to 8 mg as needed   Allergic Conjunctivitis - Continue Allergen avoidance as instructed - Avoiding rubbing eyes, if irritated use a wet wash cloth to wipe allergen out of eyes  -Continue allergy Eye drops: great options include Pataday (Olopatadine) or Zaditor (ketotifen) for eye symptoms daily as needed-both sold over the counter if not covered by insurance.   -Avoid eye drops that say red eye relief as they may contain medications that dry out your eyes. - Consider allergen immunotherapy if symptoms worsen or you desire to reduce lifetime use of medications.   Follow up: we will call you will lab results and next step  Thank you so much for letting me partake in your care today.  Don't hesitate to reach out if you have any additional concerns!  Orelia Binet, MD  [facility] [place], [place]     Other:    Thank you so much for letting me partake in your care today.  Don't hesitate to reach out if you have any additional concerns!  Orelia Binet, MD  [facility] [place], [place]

## 2023-12-31 NOTE — Patient Instructions (Addendum)
 Chronic rhinosinusitis with recurrent ear/ sinus infections + ETD - Start Prednisone  10mg  : Take 2 tablets twice a day for 3 more days, Then take 2 tablets once a day for 1 day., then take 1 tablet once a day for 1 day.  - Will get repeat strep pneumoniae titers after vaccination.  If NON protective will recommend replacement immunoglobulin for a diagnosis of specific antibody deficiency  - Records release obtained for prior ENT evaluation including previous sinus Cts  - We will refer you to [facility] - Dr. Lydia Sams   - Symptom control: - Continue Dymista   1 sprays in each nostril twice a day:  Use daily  - Continue Atrovent  (Ipratropium Bromide ) 1-2 sprays in each nostril up to 3 times a day as needed for runny nose/post nasal drip/drainage.   - Continue carbinoxamine  4mg  3 times a day, can increase up to 8 mg as needed   Allergic Conjunctivitis - Continue Allergen avoidance as instructed - Avoiding rubbing eyes, if irritated use a wet wash cloth to wipe allergen out of eyes  -Continue allergy Eye drops: great options include Pataday (Olopatadine) or Zaditor (ketotifen) for eye symptoms daily as needed-both sold over the counter if not covered by insurance.   -Avoid eye drops that say red eye relief as they may contain medications that dry out your eyes. - Consider allergen immunotherapy if symptoms worsen or you desire to reduce lifetime use of medications.   Follow up: we will call you will lab results and next step  Thank you so much for letting me partake in your care today.  Don't hesitate to reach out if you have any additional concerns!  Orelia Binet, MD  [facility] [place], [place]

## 2023-12-31 NOTE — Addendum Note (Signed)
 Addended by: Alla Sloma M on: 12/31/2023 01:29 PM   Modules accepted: Orders

## 2024-01-04 ENCOUNTER — Telehealth: Payer: Self-pay | Admitting: Internal Medicine

## 2024-01-04 NOTE — Telephone Encounter (Signed)
 Peter Curtis has been internally referred to see Dr. Lydia Sams at [facility].  They have already reached out to him on 01/02/2024.

## 2024-01-06 LAB — STREP PNEUMONIAE 23 SEROTYPES IGG
Pneumo Ab Type 1*: 2.4 ug/mL (ref >1.3)
Pneumo Ab Type 12 (12F)*: <0.1 ug/mL — ABNORMAL LOW (ref >1.3)
Pneumo Ab Type 14*: 11.2 ug/mL (ref >1.3)
Pneumo Ab Type 17 (17F)*: 1.9 ug/mL (ref >1.3)
Pneumo Ab Type 19 (19F)*: 7.6 ug/mL (ref >1.3)
Pneumo Ab Type 2*: 2 ug/mL (ref >1.3)
Pneumo Ab Type 20*: 12.8 ug/mL (ref >1.3)
Pneumo Ab Type 22 (22F)*: 1.1 ug/mL — ABNORMAL LOW (ref >1.3)
Pneumo Ab Type 23 (23F)*: 1.1 ug/mL — ABNORMAL LOW (ref >1.3)
Pneumo Ab Type 26 (6B)*: 0.4 ug/mL — ABNORMAL LOW (ref >1.3)
Pneumo Ab Type 3*: 0.2 ug/mL — ABNORMAL LOW (ref >1.3)
Pneumo Ab Type 34 (10A)*: 0.6 ug/mL — ABNORMAL LOW (ref >1.3)
Pneumo Ab Type 4*: 0.2 ug/mL — ABNORMAL LOW (ref >1.3)
Pneumo Ab Type 43 (11A)*: 0.4 ug/mL — ABNORMAL LOW (ref >1.3)
Pneumo Ab Type 5*: 1.2 ug/mL — ABNORMAL LOW (ref >1.3)
Pneumo Ab Type 51 (7F)*: >19.6 ug/mL (ref >1.3)
Pneumo Ab Type 54 (15B)*: 0.6 ug/mL — ABNORMAL LOW (ref >1.3)
Pneumo Ab Type 56 (18C)*: 0.5 ug/mL — ABNORMAL LOW (ref >1.3)
Pneumo Ab Type 57 (19A)*: 7.5 ug/mL (ref >1.3)
Pneumo Ab Type 68 (9V)*: 0.9 ug/mL — ABNORMAL LOW (ref >1.3)
Pneumo Ab Type 70 (33F)*: >20.2 ug/mL (ref >1.3)
Pneumo Ab Type 8*: 8.3 ug/mL (ref >1.3)
Pneumo Ab Type 9 (9N)*: 4.4 ug/mL (ref >1.3)

## 2024-01-07 ENCOUNTER — Ambulatory Visit: Payer: Self-pay | Admitting: Internal Medicine

## 2024-01-07 NOTE — Progress Notes (Signed)
 Blood work returned with non protective titers to strep pneumoniae.  This is consistent with a diagnosis of specific antibody deficiency. I recommend starting replacement immunoglobulin as discussed during his visit.  If he would like to start I will reach out to tammy

## 2024-01-11 NOTE — Telephone Encounter (Signed)
 Peter Curtis has been scheduled for 03/17/24 at 3:30 pm with Dr. Lydia Sams.

## 2024-01-16 NOTE — Telephone Encounter (Signed)
 Called patient to discuss Immunoglobulin therapy SCIG vs IVIG and patient wants to try SCIG. Per Dr Lorin 100mg /kg to start which will be 2 grams weekly. Advised patient Rx to [facility] they will reach out regarding shipment and nursing for same

## 2024-01-16 NOTE — Telephone Encounter (Signed)
-----   Message from [facility] sent at 01/09/2024  1:54 PM EDT ----- Pt informed of results. He would like to move forward with immunoglobulin therapy. Peter Curtis please advise.  ----- Message ----- From: Lorin Norris, MD Sent: 01/07/2024  10:29 AM EDT To: [facility] [place] [facility]  Blood work returned with non protective titers to strep pneumoniae.  This is consistent with a diagnosis of specific antibody deficiency. I recommend starting replacement immunoglobulin as discussed  during his visit.  If he would like to start I will reach out to Thao Vanover  ----- Message ----- From: Interface, [facility] Lab Results In Sent: 01/01/2024  10:36 AM EDT To: Norris Lorin, MD

## 2024-01-22 NOTE — Telephone Encounter (Signed)
 Thank you :)

## 2024-01-22 NOTE — Telephone Encounter (Signed)
 Patient called and discussed with me that he would rather do IVIG so I have sent new order for same to [facility] to change to IVIG. Confirmed with company they will work with him with nursing around his work schedule which is a concern for him

## 2024-02-29 ENCOUNTER — Ambulatory Visit: Payer: Commercial Managed Care - PPO | Admitting: Internal Medicine

## 2024-02-29 VITALS — BP 122/70 | HR 78 | Resp 16

## 2024-02-29 DIAGNOSIS — K219 Gastro-esophageal reflux disease without esophagitis: Secondary | ICD-10-CM

## 2024-02-29 DIAGNOSIS — J342 Deviated nasal septum: Secondary | ICD-10-CM | POA: Diagnosis not present

## 2024-02-29 DIAGNOSIS — J310 Chronic rhinitis: Secondary | ICD-10-CM | POA: Diagnosis not present

## 2024-02-29 DIAGNOSIS — D806 Antibody deficiency with near-normal immunoglobulins or with hyperimmunoglobulinemia: Secondary | ICD-10-CM

## 2024-02-29 DIAGNOSIS — H1045 Other chronic allergic conjunctivitis: Secondary | ICD-10-CM

## 2024-02-29 NOTE — Patient Instructions (Addendum)
 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   - Continue carbinoxamine  4mg  3 times a day, can increase up to 8 mg as needed   Allergic Conjunctivitis - Continue Allergen avoidance as instructed - Avoiding rubbing eyes, if irritated use a wet wash cloth to wipe allergen out of eyes  -Continue allergy Eye drops: great options include Pataday (Olopatadine) or Zaditor (ketotifen) for eye symptoms daily as needed-both sold over the counter if not covered by insurance.   -Avoid eye drops that say red eye relief as they may contain medications that dry out your eyes. - Consider allergen immunotherapy if symptoms worsen or you desire to reduce lifetime use of medications.   Follow up: 6 months   Thank you so much for letting me partake in your care today.  Don't hesitate to reach out if you have any additional concerns!  Hargis Springer, MD  [facility] [place], [place]

## 2024-02-29 NOTE — Progress Notes (Signed)
 FOLLOW UP Date of Service/Encounter:  03/02/24  Subjective:  Peter Curtis (DOB: 06/25/1966) is a 58 y.o. male who [facility]24 in re-evaluation of the following: specific antibody deficiency, ETD, allergic rhinoconjunctiviitis  History obtained from: chart review and patient.  For Review, LV was on 12/31/23  with Dr. Lorin seen for acute visit for sinus infection . See below for summary of history and diagnostics.   Today presents for follow-up. Discussed the use of AI scribe software for clinical note transcription with the patient, who gave verbal consent to proceed.  History of Present Illness Peter Curtis is a 58 year old male with immunodeficiency who presents for follow-up after starting IVIG treatment.  Recurrent sinopulmonary infections - Frequent sinus infections, approximately once per month - One sinus infection since starting IVIG infusions on February 09, 2024 - Recent sinus infection treated with cefdinir and prednisone , resulting in symptom improvement - started on July 21st  - Persistent sensation of inflammation and fluid in the ear following recent infection, without evidence of active infection on nursing evaluation - Negative tests for pneumonia, COVID-19, and influenza during recent evaluations for sinus infections  IVIG: no adverse effects.   Was told he owed 2000 for the treatment has he had not met his deductible yet.     Response to immunoglobulin therapy - No significant change in overall condition since starting IVIG infusions - Concern regarding the long-term efficacy of antibiotics for recurrent infections     All medications reviewed by clinical staff and updated in chart. No new pertinent medical or surgical history except as noted in HPI.  ROS: All others negative except as noted per HPI.   Objective:  BP 122/70   Pulse 78   Resp 16   SpO2 97%  There is no height or weight on file to calculate  BMI. Physical Exam: General Appearance:  Alert, cooperative, no distress, appears stated age  Head:  Normocephalic, without obvious abnormality, atraumatic  Eyes:  Conjunctiva clear, EOM's intact  Ears EACs normal bilaterally and normal TMs bilaterally  Nose: Nares normal, normal mucosa, no visible anterior polyps, and septum midline  Throat: Lips, tongue normal; teeth and gums normal, normal posterior oropharynx  Neck: Supple, symmetrical  Lungs:   clear to auscultation bilaterally, Respirations unlabored, no coughing  Heart:  regular rate and rhythm and no murmur, Appears well perfused  Extremities: No edema  Skin: Skin color, texture, turgor normal and no rashes or lesions on visualized portions of skin  Neurologic: No gross deficits   Labs:  Lab Orders  No laboratory test(s) ordered today      Assessment/Plan   Patient Instructions  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   - Continue carbinoxamine  4mg  3 times a day, can increase up to 8 mg as needed   Allergic Conjunctivitis - Continue Allergen avoidance as instructed - Avoiding rubbing eyes, if irritated use a wet wash cloth to wipe allergen out of eyes  -Continue allergy Eye drops: great options include Pataday (Olopatadine) or Zaditor (ketotifen) for eye symptoms daily as needed-both sold over the  counter if not covered by insurance.   -Avoid eye drops that say red eye relief as they may contain medications that dry out your eyes. - Consider allergen immunotherapy if symptoms worsen or you desire to reduce lifetime use of medications.   Follow up: 6 months   Thank you so much  for letting me partake in your care today.  Don't hesitate to reach out if you have any additional concerns!  Hargis Springer, MD  [facility] [place], [place]  Other:    Thank you so much for letting me partake in your care today.  Don't hesitate to reach out if you have any additional concerns!  Hargis Springer, MD  [facility] , [place]

## 2024-03-17 ENCOUNTER — Institutional Professional Consult (permissible substitution) (INDEPENDENT_AMBULATORY_CARE_PROVIDER_SITE_OTHER): Admitting: Otolaryngology

## 2024-04-14 ENCOUNTER — Other Ambulatory Visit: Payer: Self-pay

## 2024-04-14 ENCOUNTER — Ambulatory Visit: Admitting: Internal Medicine

## 2024-04-14 VITALS — BP 126/70 | HR 71 | Temp 98.0°F | Resp 18 | Wt 173.3 lb

## 2024-04-14 DIAGNOSIS — D806 Antibody deficiency with near-normal immunoglobulins or with hyperimmunoglobulinemia: Secondary | ICD-10-CM | POA: Diagnosis not present

## 2024-04-14 DIAGNOSIS — H9313 Tinnitus, bilateral: Secondary | ICD-10-CM

## 2024-04-14 DIAGNOSIS — H6993 Unspecified Eustachian tube disorder, bilateral: Secondary | ICD-10-CM

## 2024-04-14 DIAGNOSIS — K219 Gastro-esophageal reflux disease without esophagitis: Secondary | ICD-10-CM

## 2024-04-14 DIAGNOSIS — F5102 Adjustment insomnia: Secondary | ICD-10-CM | POA: Diagnosis not present

## 2024-04-14 MED ORDER — DOXYCYCLINE MONOHYDRATE 100 MG PO TABS: 100.0000 mg | ORAL_TABLET | Freq: Two times a day (BID) | ORAL | 0 refills | Status: AC

## 2024-04-14 MED ORDER — HYDROXYZINE PAMOATE 25 MG PO CAPS: 25.0000 mg | ORAL_CAPSULE | Freq: Three times a day (TID) | ORAL | 1 refills | Status: AC | PRN

## 2024-04-14 NOTE — Patient Instructions (Addendum)
 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   - Continue carbinoxamine  4mg  3 times a day, can increase up to 8 mg as needed   Allergic Conjunctivitis - Continue Allergen avoidance as instructed - Avoiding rubbing eyes, if irritated use a wet wash cloth to wipe allergen out of eyes  -Continue allergy Eye drops: great options include Pataday (Olopatadine) or Zaditor (ketotifen) for eye symptoms daily as needed-both sold over the counter if not covered by insurance.   -Avoid eye drops that say red eye relief as they may contain medications that dry out your eyes. - Consider allergen immunotherapy if symptoms worsen or you desire to reduce lifetime use of medications.   Follow up: 6 months   Thank you so much for letting me partake in your care today.  Don't hesitate to reach out if you have any additional concerns!  Hargis Springer, MD  [facility] [place], [place]

## 2024-04-14 NOTE — Progress Notes (Signed)
 FOLLOW UP Date of Service/Encounter:  04/15/24  Subjective:  Peter Curtis (DOB: Sep 18, 1965) is a 58 y.o. male who [facility] 04[facility]14/2024 in re-evaluation of the following: specific antibody deficiency, ETD, allergic rhinoconjunctiviitis  History obtained from: chart review and patient.  For Review, LV was on 12/31/23  with Dr. Lorin seen for acute visit for sinus infection . See below for summary of history and diagnostics.   Today presents for follow-up. Discussed the use of AI scribe software for clinical note transcription with the patient, who gave verbal consent to proceed.  History of Present Illness Peter Curtis is a 58 year old male who presents with insomnia and ear fullness.  Insomnia - Significant insomnia since recent infusion treatment last Saturday - Inability to 'shut his brain off,' resulting in sleepless nights similar to effects of prednisone  - Remained awake for up to 36 hours, impairing daytime functioning - Took two Benadryl and two Tylenol as pre-medication prior to infusion without improvement in sleep - Able to sleep after taking Klonopin (not used in a year), but awoke easily and could not return to sleep - Benadryl has not been effective for sleep - Insomnia onset temporally related to infusion - Increased stress due to wife's job loss and caregiving responsibilities for mother-in-law with dementia may be contributing factors  Otic fullness and tinnitus - Ear fullness and tinnitus, particularly when sweating or after heat exposure - Ears feel 'clogged completely up' - Vertigo-like sensation with swaying or feeling of falling forward when sitting still - No headaches or sinus pressure - Maintains good hydration - Currently using a nasal spray and Corbinoxime  Gastrointestinal symptoms - No appetite and nausea at the thought of eating - Has eaten only once in the past three days     All medications reviewed by  clinical staff and updated in chart. No new pertinent medical or surgical history except as noted in HPI.  ROS: All others negative except as noted per HPI.   Objective:  BP 126/70   Pulse 71   Temp 98 F (36.7 C) (Temporal)   Resp 18   Wt 173 lb 4.8 oz (78.6 kg)   SpO2 97%   BMI 23.50 kg/m  Body mass index is 23.5 kg/m. Physical Exam: General Appearance:  Alert, cooperative, no distress, appears stated age  Head:  Normocephalic, without obvious abnormality, atraumatic  Eyes:  Conjunctiva clear, EOM's intact  Ears EACs normal bilaterally and normal TMs bilaterally  Nose: Nares normal, normal mucosa, no visible anterior polyps, and septum midline  Throat: Lips, tongue normal; teeth and gums normal, normal posterior oropharynx  Neck: Supple, symmetrical  Lungs:   clear to auscultation bilaterally, Respirations unlabored, no coughing  Heart:  regular rate and rhythm and no murmur, Appears well perfused  Extremities: No edema  Skin: Skin color, texture, turgor normal and no rashes or lesions on visualized portions of skin  Neurologic: No gross deficits   Labs:  Lab Orders  No laboratory test(s) ordered today      Assessment/Plan  Overall I do not think Bonni is having a sinus infection currently.  However given his underlying specific antibody deficiency reported history of ear fullness as a prodrome to sinus infections will send a prescription for doxycycline  for him to have on hand.  I suspect his many of his symptoms are due to insomnia which has likely been triggered by recent stressors.  Discussed importance of getting a good night sleep, sleep  hygiene and read prescription for hydroxyzine .This fails recommend him discuss with PCP for further sleep aids.   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   - Continue carbinoxamine  4mg  3 times a day, can increase up to 8 mg as needed   Allergic Conjunctivitis - Continue Allergen avoidance as instructed - Avoiding rubbing eyes, if irritated use a wet wash cloth to wipe allergen out of eyes  -Continue allergy Eye drops: great options include Pataday (Olopatadine) or Zaditor (ketotifen) for eye symptoms daily as needed-both sold over the counter if not covered by insurance.   -Avoid eye drops that say red eye relief as they may contain medications that dry out your eyes. - Consider allergen immunotherapy if symptoms worsen or you desire to reduce lifetime use of medications.   Follow up: 6 months   Thank you so much for letting me partake in your care today.  Don't hesitate to reach out if you have any additional concerns!  Hargis Springer, MD  [facility] [place], [place]  Other:    Thank you so much for letting me partake in your care today.  Don't hesitate to reach out if you have any additional concerns!  Hargis Springer, MD  [facility] [place], [place]

## 2024-07-18 ENCOUNTER — Telehealth: Payer: Self-pay | Admitting: Allergy

## 2024-07-18 MED ORDER — ACYCLOVIR 5 % EX OINT: 1.0000 | TOPICAL_OINTMENT | CUTANEOUS | 1 refills | Status: DC

## 2024-07-18 MED ORDER — ACYCLOVIR 5 % EX OINT: 1.0000 | TOPICAL_OINTMENT | CUTANEOUS | 1 refills | Status: DC | PRN

## 2024-07-18 NOTE — Telephone Encounter (Signed)
 Patient called and states that he was diagnosed with the flu 2 days ago and still taking tamiflu and also cefdinir.  He is still feeling slightly feverish.  He noticed a fever blister on his lips.   He usually gets them maybe once a year.   Asked patient to take a picture of it.  Will send in topical ointment to try first. If no improvement will send in valtrex next.

## 2024-08-08 ENCOUNTER — Encounter: Payer: Self-pay | Admitting: Internal Medicine

## 2024-08-08 ENCOUNTER — Ambulatory Visit (INDEPENDENT_AMBULATORY_CARE_PROVIDER_SITE_OTHER): Payer: Self-pay | Admitting: Internal Medicine

## 2024-08-08 VITALS — BP 126/82 | HR 83 | Resp 16

## 2024-08-08 DIAGNOSIS — F419 Anxiety disorder, unspecified: Secondary | ICD-10-CM | POA: Diagnosis not present

## 2024-08-08 DIAGNOSIS — F5102 Adjustment insomnia: Secondary | ICD-10-CM

## 2024-08-08 DIAGNOSIS — Z720 Tobacco use: Secondary | ICD-10-CM | POA: Diagnosis not present

## 2024-08-08 DIAGNOSIS — H1045 Other chronic allergic conjunctivitis: Secondary | ICD-10-CM | POA: Diagnosis not present

## 2024-08-08 DIAGNOSIS — H6993 Unspecified Eustachian tube disorder, bilateral: Secondary | ICD-10-CM

## 2024-08-08 DIAGNOSIS — J310 Chronic rhinitis: Secondary | ICD-10-CM | POA: Diagnosis not present

## 2024-08-08 DIAGNOSIS — D806 Antibody deficiency with near-normal immunoglobulins or with hyperimmunoglobulinemia: Secondary | ICD-10-CM | POA: Diagnosis not present

## 2024-08-08 NOTE — Patient Instructions (Addendum)
 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   - Continue carbinoxamine  4mg  2-3 times a day, can increase up to 8 mg as needed   Allergic Conjunctivitis - Continue Allergen avoidance as instructed - Avoiding rubbing eyes, if irritated use a wet wash cloth to wipe allergen out of eyes  -Continue allergy Eye drops: great options include Pataday (Olopatadine) or Zaditor (ketotifen) for eye symptoms daily as needed-both sold over the counter if not covered by insurance.   -Avoid eye drops that say red eye relief as they may contain medications that dry out your eyes. - Consider allergen immunotherapy if symptoms worsen or you desire to reduce lifetime use of medications.    Insomnia  - Continue hydroxyzine  25mg  a night as needed - Continue to work on anxiety   Follow up: 6 months   Thank you so much for letting me partake in your care today.  Don't hesitate to reach out if you have any additional concerns!  Hargis Springer, MD  [facility] [place], [place]

## 2024-08-08 NOTE — Progress Notes (Signed)
 "  FOLLOW UP Date of Service/Encounter:  08/08/24  Subjective:  Peter Curtis (DOB: 1966/07/12) is a 59 y.o. male who [facility] 08[facility]/2024 in re-evaluation of the following: specific antibody deficiency, ETD, allergic rhinoconjunctiviitis  History obtained from: chart review and patient.  For Review, LV was on 04/14/24  with Dr. Lorin seen for acute visit for sinus infection . See below for summary of history and diagnostics.   Today presents for follow-up. Discussed the use of AI scribe software for clinical note transcription with the patient, who gave verbal consent to proceed.  History of Present Illness Peter Curtis is a 59 year old male with recurrent infections who presents for follow-up regarding his infusion therapy and recent rash.  Recurrent infections - Experienced influenza over Christmas, confirmed by testing, with fever up to 102F. - Received two courses of antibiotics in the past six months, including Cefdinir  and levaquin  - Received a steroid injection during one infection episode. - Prior to IVIG, infections occurred monthly; now less frequent.  Cutaneous eruptions - Rash developed on palms and soles approximately two to three weeks after infusions. - Rash fades and reappears around the time of next infusion. - Pruritus and small blisters present on arms and legs, especially in areas covered by socks. - Suspects rash may be related to infusion medication. Contacted pharmacy and they plan to switch from gammunex to different brand.  He is scheduled to get new brand next Friday and will let us  know which one   Rhintiis  - well controlled on current regimen  - no AE from medications   Tobacco use - Smokes and is attempting to reduce smoking habit.  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 - Occasionally takes hydroxyzine   to aid sleep during periods of anxiety.     All medications reviewed by clinical staff and updated in chart. No new pertinent medical or surgical history except as noted in HPI.  ROS: All others negative except as noted per HPI.   Objective:  BP 126/82   Pulse 83   Resp 16   SpO2 98%  There is no height or weight on file to calculate BMI. Physical Exam: General Appearance:  Alert, cooperative, no distress, appears stated age  Head:  Normocephalic, without obvious abnormality, atraumatic  Eyes:  Conjunctiva clear, EOM's intact  Ears EACs normal bilaterally and normal TMs bilaterally  Nose: Nares normal, normal mucosa, no visible anterior polyps, and septum midline  Throat: Lips, tongue normal; teeth and gums normal, normal posterior oropharynx  Neck: Supple, symmetrical  Lungs:   clear to auscultation bilaterally, Respirations unlabored, no coughing  Heart:  regular rate and rhythm and no murmur, Appears well perfused  Extremities: No edema  Skin: Skin color, texture, turgor normal and no rashes or lesions on visualized portions of skin  Neurologic: No gross deficits   Labs:  Lab Orders  No laboratory test(s) ordered today      Assessment/Plan   Patient Instructions  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   - Continue carbinoxamine  4mg  2-3 times a day, can increase up to 8 mg as needed   Allergic Conjunctivitis - Continue Allergen avoidance as instructed - Avoiding rubbing eyes, if irritated use a wet wash cloth to wipe allergen out of eyes  -Continue allergy Eye drops: great options  include Pataday (Olopatadine) or Zaditor (ketotifen) for eye symptoms daily as needed-both sold over the counter if not covered by insurance.   -Avoid eye drops that say red eye relief as they may contain medications that dry out your eyes. - Consider allergen immunotherapy if symptoms worsen or you desire to reduce lifetime use of medications.    Insomnia  - Continue hydroxyzine  25mg  a night as needed - Continue to work on anxiety   Follow up: 6 months   Thank you so much for letting me partake in your care today.  Don't hesitate to reach out if you have any additional concerns!  Peter Springer, MD  [facility] [place], [place]  Other:    Thank you so much for letting me partake in your care today.  Don't hesitate to reach out if you have any additional concerns!  Peter Springer, MD  [facility] [place], [place]        "

## 2024-08-19 ENCOUNTER — Other Ambulatory Visit: Payer: Self-pay | Admitting: Allergy

## 2024-08-26 ENCOUNTER — Other Ambulatory Visit: Payer: Self-pay | Admitting: Internal Medicine

## 2024-08-27 ENCOUNTER — Other Ambulatory Visit: Payer: Self-pay | Admitting: Internal Medicine
